# Patient Record
Sex: Male | Born: 1990 | Race: White | Hispanic: No | Marital: Single | Smoking: Current every day smoker
Health system: Southern US, Community
[De-identification: ages and names within clinical notes are randomized; demographics above are authoritative.]

## PROBLEM LIST (undated history)

## (undated) HISTORY — PX: HERNIA REPAIR: SHX51

---

## 2013-01-07 ENCOUNTER — Encounter (HOSPITAL_COMMUNITY): Payer: Self-pay | Admitting: *Deleted

## 2013-01-07 ENCOUNTER — Emergency Department (HOSPITAL_COMMUNITY)
Admission: EM | Admit: 2013-01-07 | Discharge: 2013-01-07 | Disposition: A | Discharge status: Home / Self Care | Payer: Self-pay | Attending: Emergency Medicine | Admitting: Emergency Medicine

## 2013-01-07 DIAGNOSIS — J3489 Other specified disorders of nose and nasal sinuses: Secondary | ICD-10-CM | POA: Insufficient documentation

## 2013-01-07 DIAGNOSIS — K089 Disorder of teeth and supporting structures, unspecified: Secondary | ICD-10-CM | POA: Insufficient documentation

## 2013-01-07 DIAGNOSIS — Z79899 Other long term (current) drug therapy: Secondary | ICD-10-CM | POA: Insufficient documentation

## 2013-01-07 DIAGNOSIS — J339 Nasal polyp, unspecified: Secondary | ICD-10-CM | POA: Insufficient documentation

## 2013-01-07 DIAGNOSIS — F17200 Nicotine dependence, unspecified, uncomplicated: Secondary | ICD-10-CM | POA: Insufficient documentation

## 2013-01-07 DIAGNOSIS — K0889 Other specified disorders of teeth and supporting structures: Secondary | ICD-10-CM

## 2013-01-07 MED ORDER — AMOXICILLIN 500 MG PO CAPS: 500.0000 mg | ORAL_CAPSULE | Freq: Three times a day (TID) | ORAL | Status: DC

## 2013-01-07 MED ORDER — TRAMADOL HCL 50 MG PO TABS: 50.0000 mg | ORAL_TABLET | Freq: Four times a day (QID) | ORAL | Status: DC | PRN

## 2013-01-07 NOTE — ED Provider Notes (Signed)
History     CSN: [id number]  Arrival date & time 01/07/13  1059   First MD Initiated Contact with Patient 01/07/13 1153      Chief Complaint  Patient presents with  . Dental Pain    (Consider location/radiation/quality/duration/timing/severity/associated sxs/prior treatment) Patient is a 22 y.o. male presenting with tooth pain. The history is provided by the patient.  Dental Pain Location:  Upper Upper teeth location:  14/LU 1st molar, 13/LU 2nd bicuspid and 12/LU 1st bicuspid Quality:  Throbbing and sharp Severity:  Moderate Onset quality:  Gradual Duration:  4 days Timing:  Constant Progression:  Worsening Chronicity:  Recurrent Context: dental caries and poor dentition   Prior workup: none. Relieved by:  Nothing Worsened by:  Cold food/drink and hot food/drink Ineffective treatments:  NSAIDs Associated symptoms: congestion   Associated symptoms: no difficulty swallowing, no drooling, no facial pain, no facial swelling, no fever, no gum swelling, no headaches, no neck pain, no neck swelling, no oral bleeding, no oral lesions and no trismus   Associated symptoms comment:  Difficulty breathing from the right nostril.   Congestion:    Location:  Nasal   Interferes with sleep: no     Interferes with eating/drinking: no   Risk factors: periodontal disease and smoking     History reviewed. No pertinent past medical history.  History reviewed. No pertinent past surgical history.  No family history on file.  History  Substance Use Topics  . Smoking status: Current Every Day Smoker  . Smokeless tobacco: Not on file  . Alcohol Use: No      Review of Systems  Constitutional: Negative for fever and appetite change.  HENT: Positive for congestion and dental problem. Negative for sore throat, facial swelling, drooling, mouth sores, trouble swallowing, neck pain and neck stiffness.   Eyes: Negative for pain and visual disturbance.  Neurological: Negative for dizziness,  facial asymmetry and headaches.  Hematological: Negative for adenopathy.  All other systems reviewed and are negative.    Allergies  Review of patient's allergies indicates no known allergies.  Home Medications   Current Outpatient Rx  Name  Route  Sig  Dispense  Refill  . ibuprofen (ADVIL,MOTRIN) 200 MG tablet   Oral   Take 800 mg by mouth every 6 (six) hours as needed for pain.           BP 127/84  Pulse 90  Temp(Src) 98.5 F (36.9 C) (Oral)  Resp 17  Ht 6\' 3"  (1.905 m)  Wt 189 lb 5 oz (85.872 kg)  BMI 23.66 kg/m2  SpO2 100%  Physical Exam  Nursing note and vitals reviewed. Constitutional: He is oriented to person, place, and time. He appears well-developed and well-nourished. No distress.  HENT:  Head: Normocephalic and atraumatic. No trismus in the jaw.  Right Ear: Tympanic membrane and ear canal normal.  Left Ear: Tympanic membrane and ear canal normal.  Mouth/Throat: Uvula is midline, oropharynx is clear and moist and mucous membranes are normal. Dental caries present. No dental abscesses or edematous.  Multiple dental caries and poor dentition.  ttp of the upper left lateral incisors and premolars without obvious dental abscess at this time.  No facial edema or trismus.  Patient also has a single nasal polyp of the right nostril  Neck: Normal range of motion. Neck supple.  Cardiovascular: Normal rate, regular rhythm and normal heart sounds.   No murmur heard. Pulmonary/Chest: Effort normal and breath sounds normal. No respiratory distress.  Musculoskeletal: Normal  range of motion.  Lymphadenopathy:    He has no cervical adenopathy.  Neurological: He is alert and oriented to person, place, and time. He exhibits normal muscle tone. Coordination normal.  Skin: Skin is warm and dry.    ED Course  Procedures (including critical care time)  Labs Reviewed - No data to display No results found.      MDM    Patient agrees to f/u with a dentist at the  upcoming dental clinic.  Patient appears stable for discharge at this time.  No obvious dental abscess.      Gevon Markus L. Trisha Mangle, PA-C 01/07/13 1252

## 2013-01-07 NOTE — ED Notes (Signed)
Dental pain x 3-4 days

## 2013-01-08 NOTE — ED Provider Notes (Signed)
Medical screening examination/treatment/procedure(s) were performed by non-physician practitioner and as supervising physician I was immediately available for consultation/collaboration.  Raeford Razor, MD 01/08/13 1136

## 2013-12-03 ENCOUNTER — Emergency Department (HOSPITAL_COMMUNITY)
Admission: EM | Admit: 2013-12-03 | Discharge: 2013-12-03 | Disposition: A | Discharge status: Home / Self Care | Payer: Self-pay | Attending: Emergency Medicine | Admitting: Emergency Medicine

## 2013-12-03 ENCOUNTER — Encounter (HOSPITAL_COMMUNITY): Payer: Self-pay | Admitting: Emergency Medicine

## 2013-12-03 DIAGNOSIS — Z79899 Other long term (current) drug therapy: Secondary | ICD-10-CM | POA: Insufficient documentation

## 2013-12-03 DIAGNOSIS — F17200 Nicotine dependence, unspecified, uncomplicated: Secondary | ICD-10-CM | POA: Insufficient documentation

## 2013-12-03 DIAGNOSIS — Z791 Long term (current) use of non-steroidal anti-inflammatories (NSAID): Secondary | ICD-10-CM | POA: Insufficient documentation

## 2013-12-03 DIAGNOSIS — R599 Enlarged lymph nodes, unspecified: Secondary | ICD-10-CM | POA: Insufficient documentation

## 2013-12-03 DIAGNOSIS — K029 Dental caries, unspecified: Secondary | ICD-10-CM | POA: Insufficient documentation

## 2013-12-03 DIAGNOSIS — Z792 Long term (current) use of antibiotics: Secondary | ICD-10-CM | POA: Insufficient documentation

## 2013-12-03 DIAGNOSIS — R110 Nausea: Secondary | ICD-10-CM | POA: Insufficient documentation

## 2013-12-03 DIAGNOSIS — R51 Headache: Secondary | ICD-10-CM | POA: Insufficient documentation

## 2013-12-03 DIAGNOSIS — K047 Periapical abscess without sinus: Secondary | ICD-10-CM | POA: Insufficient documentation

## 2013-12-03 MED ORDER — OXYCODONE-ACETAMINOPHEN 5-325 MG PO TABS
1.0000 | ORAL_TABLET | Freq: Once | ORAL | Status: AC
Start: 2013-12-03 — End: 2013-12-03
  Administered 2013-12-03: 1 via ORAL
  Filled 2013-12-03: qty 1

## 2013-12-03 MED ORDER — AMOXICILLIN 500 MG PO CAPS
500.0000 mg | ORAL_CAPSULE | Freq: Three times a day (TID) | ORAL | Status: DC
Start: 2013-12-03 — End: 2014-08-23

## 2013-12-03 MED ORDER — IBUPROFEN 600 MG PO TABS: 600.0000 mg | ORAL_TABLET | Freq: Four times a day (QID) | ORAL | Status: DC | PRN

## 2013-12-03 MED ORDER — HYDROCODONE-ACETAMINOPHEN 5-325 MG PO TABS: 1.0000 | ORAL_TABLET | ORAL | Status: DC | PRN

## 2013-12-03 NOTE — Discharge Instructions (Signed)
Abscessed Tooth °An abscessed tooth is an infection around your tooth. It may be caused by holes or damage to the tooth (cavity) or a dental disease. An abscessed tooth causes mild to very bad pain in and around the tooth. See your dentist right away if you have tooth or gum pain. °HOME CARE °· Take your medicine as told. Finish it even if you start to feel better. °· Do not drive after taking pain medicine. °· Rinse your mouth (gargle) often with salt water (¼ teaspoon salt in 8 ounces of warm water). °· Do not apply heat to the outside of your face. °GET HELP RIGHT AWAY IF:  °· You have a temperature by mouth above 102° F (38.9° C), not controlled by medicine. °· You have chills and a very bad headache. °· You have problems breathing or swallowing. °· Your mouth will not open. °· You develop puffiness (swelling) on the neck or around the eye. °· Your pain is not helped by medicine. °· Your pain is getting worse instead of better. °MAKE SURE YOU:  °· Understand these instructions. °· Will watch your condition. °· Will get help right away if you are not doing well or get worse. °Document Released: 01/16/2008 Document Revised: 10/22/2011 Document Reviewed: 11/07/2010 °ExitCare® Patient Information ©2014 [employer]. ° °Dental Care and Dentist Visits °Dental care supports good overall health. Regular dental visits can also help you avoid dental pain, bleeding, infection, and other more serious health problems in the future. It is important to keep the mouth healthy because diseases in the teeth, gums, and other oral tissues can spread to other areas of the body. Some problems, such as diabetes, heart disease, and pre-term labor have been associated with poor oral health.  °See your dentist every 6 months. If you experience emergency problems such as a toothache or broken tooth, go to the dentist right away. If you see your dentist regularly, you may catch problems early. It is easier to be treated for problems in  the early stages.  °WHAT TO EXPECT AT A DENTIST VISIT  °Your dentist will look for many common oral health problems and recommend proper treatment. At your regular dental visit, you can expect: °· Gentle cleaning of the teeth and gums. This includes scraping and polishing. This helps to remove the sticky substance around the teeth and gums (plaque). Plaque forms in the mouth shortly after eating. Over time, plaque hardens on the teeth as tartar. If tartar is not removed regularly, it can cause problems. Cleaning also helps remove stains. °· Periodic X-rays. These pictures of the teeth and supporting bone will help your dentist assess the health of your teeth. °· Periodic fluoride treatments. Fluoride is a natural mineral shown to help strengthen teeth. Fluoride treatment involves applying a fluoride gel or varnish to the teeth. It is most commonly done in children. °· Examination of the mouth, tongue, jaws, teeth, and gums to look for any oral health problems, such as: °· Cavities (dental caries). This is decay on the tooth caused by plaque, sugar, and acid in the mouth. It is best to catch a cavity when it is small. °· Inflammation of the gums caused by plaque buildup (gingivitis). °· Problems with the mouth or malformed or misaligned teeth. °· Oral cancer or other diseases of the soft tissues or jaws.  °KEEP YOUR TEETH AND GUMS HEALTHY °For healthy teeth and gums, follow these general guidelines as well as your dentist's specific advice: °· Have your teeth professionally cleaned at   the dentist every 6 months. °· Brush twice daily with a fluoride toothpaste. °· Floss your teeth daily.  °· Ask your dentist if you need fluoride supplements, treatments, or fluoride toothpaste. °· Eat a healthy diet. Reduce foods and drinks with added sugar. °· Avoid smoking. °TREATMENT FOR ORAL HEALTH PROBLEMS °If you have oral health problems, treatment varies depending on the conditions present in your teeth and gums. °· Your  caregiver will most likely recommend good oral hygiene at each visit. °· For cavities, gingivitis, or other oral health disease, your caregiver will perform a procedure to treat the problem. This is typically done at a separate appointment. Sometimes your caregiver will refer you to another dental specialist for specific tooth problems or for surgery. °SEEK IMMEDIATE DENTAL CARE IF: °· You have pain, bleeding, or soreness in the gum, tooth, jaw, or mouth area. °· A permanent tooth becomes loose or separated from the gum socket. °· You experience a blow or injury to the mouth or jaw area. °Document Released: 04/11/2011 Document Revised: 10/22/2011 Document Reviewed: 04/11/2011 °ExitCare® Patient Information ©2014 [employer]. ° °

## 2013-12-03 NOTE — ED Provider Notes (Signed)
CSN: [id number]     Arrival date & time 12/03/13  1513 History   First MD Initiated Contact with Patient 12/03/13 1538     Chief Complaint  Patient presents with  . Oral Swelling     (Consider location/radiation/quality/duration/timing/severity/associated sxs/prior Treatment) Patient is a 23 y.o. male presenting with tooth pain. The history is provided by the patient.  Dental Pain Location:  Upper Upper teeth location:  2/RU 2nd molar and 3/RU 1st molar Quality:  Throbbing and constant Onset quality:  Gradual Duration:  3 days Timing:  Constant Progression:  Worsening Chronicity:  New Context: abscess, dental caries and poor dentition   Relieved by:  Nothing Worsened by:  Cold food/drink and pressure Ineffective treatments:  Acetaminophen Associated symptoms: facial pain    Luppino SimpsonCasey Eber is a 23 y.o. male who presents to the ED with swelling and pain to the right side of his face. The swelling started yesterday and today began having moderate pain in the upper right dental area. He denies fever or chills but has has some nausea.   History reviewed. No pertinent past medical history. History reviewed. No pertinent past surgical history. History reviewed. No pertinent family history. History  Substance Use Topics  . Smoking status: Current Every Day Smoker  . Smokeless tobacco: Not on file  . Alcohol Use: No    Review of Systems Negative except as stated in HPI   Allergies  Review of patient's allergies indicates no known allergies.  Home Medications   Prior to Admission medications   Medication Sig Start Date End Date Taking? Authorizing Provider  amoxicillin (AMOXIL) 500 MG capsule Take 1 capsule (500 mg total) by mouth 3 (three) times daily. 01/07/13   Tammy L. Triplett, PA-C  ibuprofen (ADVIL,MOTRIN) 200 MG tablet Take 800 mg by mouth every 6 (six) hours as needed for pain.    Historical Provider, MD  traMADol (ULTRAM) 50 MG tablet Take 1 tablet (50 mg total) by  mouth every 6 (six) hours as needed for pain. 01/07/13   Tammy L. Triplett, PA-C   BP 126/83  Pulse 78  Temp(Src) 98.1 F (36.7 C) (Oral)  Resp 18  Ht 6\' 3"  (1.905 m)  Wt 210 lb (95.255 kg)  BMI 26.25 kg/m2  SpO2 98% Physical Exam  Nursing note and vitals reviewed. Constitutional: He is oriented to person, place, and time. He appears well-developed and well-nourished. No distress.  HENT:  Head: Normocephalic.    Mouth/Throat: Uvula is midline, oropharynx is clear and moist and mucous membranes are normal. Dental abscesses and dental caries present.    Tenderness, swelling to the right upper dental area. Erythema and swelling of the gum surrounding the first and second molars. Facial swelling right.  Eyes: Conjunctivae and EOM are normal.  Neck: Neck supple.  Cardiovascular: Normal rate.   Pulmonary/Chest: Effort normal.  Abdominal: Soft. There is no tenderness.  Musculoskeletal: Normal range of motion.  Lymphadenopathy:    He has cervical adenopathy.  Neurological: He is alert and oriented to person, place, and time. No cranial nerve deficit.  Skin: Skin is warm and dry.  Psychiatric: He has a normal mood and affect. His behavior is normal.    ED Course  Procedures  MDM  23 y.o. male with dental pain and facial swelling that started 3 days ago. Will treat for pain and infection. Discussed with the patient and all questioned fully answered. He will follow up with a dentist as soon as possible.  He will call me  if any problems arise. Stable for discharge without fever.    Medication List         amoxicillin 500 MG capsule  Commonly known as:  AMOXIL  Take 1 capsule (500 mg total) by mouth 3 (three) times daily.     HYDROcodone-acetaminophen 5-325 MG per tablet  Commonly known as:  NORCO/VICODIN  Take 1 tablet by mouth every 4 (four) hours as needed.     ibuprofen 600 MG tablet  Commonly known as:  ADVIL,MOTRIN  Take 1 tablet (600 mg total) by mouth every 6 (six)  hours as needed.             [place]axvilleHope M Gracin Soohoo, NP 12/03/13 [address]1611  Darriel Sinquefield Orlene OchM Nannie Starzyk, NP 12/10/13 1556

## 2013-12-03 NOTE — ED Notes (Signed)
Swelling right side of face.

## 2013-12-03 NOTE — ED Notes (Signed)
D/c instructions given to pt.  Unable to get signature ,  Chart locked up.  Pt verbalized understanding.

## 2013-12-12 NOTE — ED Provider Notes (Signed)
Medical screening examination/treatment/procedure(s) were performed by non-physician practitioner and as supervising physician I was immediately available for consultation/collaboration.   EKG Interpretation None       Tavien Chestnut, MD 12/12/13 1418 

## 2014-08-23 ENCOUNTER — Emergency Department (HOSPITAL_COMMUNITY)
Admission: EM | Admit: 2014-08-23 | Discharge: 2014-08-23 | Disposition: A | Discharge status: Home / Self Care | Payer: Self-pay | Attending: Emergency Medicine | Admitting: Emergency Medicine

## 2014-08-23 ENCOUNTER — Encounter (HOSPITAL_COMMUNITY): Payer: Self-pay | Admitting: *Deleted

## 2014-08-23 DIAGNOSIS — Z720 Tobacco use: Secondary | ICD-10-CM | POA: Insufficient documentation

## 2014-08-23 DIAGNOSIS — K4090 Unilateral inguinal hernia, without obstruction or gangrene, not specified as recurrent: Secondary | ICD-10-CM | POA: Insufficient documentation

## 2014-08-23 MED ORDER — IBUPROFEN 600 MG PO TABS: 600.0000 mg | ORAL_TABLET | Freq: Four times a day (QID) | ORAL | Status: AC | PRN

## 2014-08-23 NOTE — Discharge Instructions (Signed)

## 2014-08-23 NOTE — ED Notes (Signed)
Patient states lower left abdominal/upper pelvis pain X3 months. Patient states that over the past few days the pain has increased. Patient states pain is increased with coughing and bowel movements.

## 2014-08-23 NOTE — ED Provider Notes (Signed)
CSN: [id number]     Arrival date & time 08/23/14  1738 History  This chart was scribed for Linwood DibblesJon Antron Seth, MD by Tonye RoyaltyJoshua Chen, ED Scribe. This patient was seen in room APA12/[facility]12 and the patient's care was started at 8:41 PM.    Chief Complaint  Patient presents with  . Abdominal Pain   The history is provided by the patient. No language interpreter was used.    HPI Comments: Benjamin SimpsonCasey Reyes is a 24 y.o. male who presents to the Emergency Department complaining of "painful knot" to his left lower abdomen with onset 3 months ago. [facility] and diagnosed with hernia. He states he was referred to a surgeon, but he does not have insurance. He states he was not prescribed any pain medication and complains that they did not do anything for him. He states it is more prominent when standing up. He denies vomiting, chills, fever, or bowel abnormalities.  History reviewed. No pertinent past medical history. History reviewed. No pertinent past surgical history. History reviewed. No pertinent family history. History  Substance Use Topics  . Smoking status: Current Every Day Smoker  . Smokeless tobacco: Not on file  . Alcohol Use: Yes    Review of Systems  Constitutional: Negative for fever and chills.  Gastrointestinal: Negative for vomiting, diarrhea, constipation and blood in stool.       Hernia  All other systems reviewed and are negative.     Allergies  Review of patient's allergies indicates no known allergies.  Home Medications   Prior to Admission medications   Medication Sig Start Date End Date Taking? Authorizing Provider  ibuprofen (ADVIL,MOTRIN) 600 MG tablet Take 1 tablet (600 mg total) by mouth every 6 (six) hours as needed. 08/23/14   Linwood DibblesJon Viola Placeres, MD   BP 120/71 mmHg  Pulse 70  Temp(Src) 97.9 F (36.6 C) (Oral)  Resp 18  Ht 6' 3.5" (1.918 m)  Wt 201 lb (91.173 kg)  BMI 24.78 kg/m2  SpO2 99% Physical Exam  Constitutional: He appears well-developed and  well-nourished. No distress.  HENT:  Head: Normocephalic and atraumatic.  Right Ear: External ear normal.  Left Ear: External ear normal.  Eyes: Conjunctivae are normal. Right eye exhibits no discharge. Left eye exhibits no discharge. No scleral icterus.  Neck: Neck supple. No tracheal deviation present.  Cardiovascular: Normal rate.   Pulmonary/Chest: Effort normal. No stridor. No respiratory distress.  Abdominal: Soft. He exhibits no mass. There is no tenderness. There is no rigidity, no rebound and no guarding. A hernia is present. Hernia confirmed positive in the left inguinal area.  Left inguinal hernia noticeable when patient is standing, completely resolves when he is supine  Musculoskeletal: He exhibits no edema.  Neurological: He is alert. Cranial nerve deficit: no gross deficits.  Skin: Skin is warm and dry. No rash noted.  Psychiatric: He has a normal mood and affect.  Nursing note and vitals reviewed.   ED Course  Procedures (including critical care time)  DIAGNOSTIC STUDIES: Oxygen Saturation is 100% on room air, normal by my interpretation.    COORDINATION OF CARE: 8:48 PM Discussed with patient that his hernia does not necessarily need surgery at this time since it resolves when he lays down and is easily reducible, and that the only appropriate treatment would be surgery. Will provide referral to a surgeon as well as pain medication. The patient agrees with the plan and has no further questions at this time.  MDM   Final diagnoses:  Unilateral inguinal hernia without obstruction or gangrene, recurrence not specified   Discussed the findings with the patient.  Refer as an outpatient to surgery.  Discussed warning signs that should prompt him to return to the ED emergently.  I personally performed the services described in this documentation, which was scribed in my presence.  The recorded information has been reviewed and is accurate.   Linwood Dibbles,  MD 08/23/14 2056

## 2014-08-23 NOTE — ED Notes (Signed)
Patient verbalizes understanding of discharge instructions, home care and follow up care. Patient ambulatory out of department at this time with family. 

## 2014-08-23 NOTE — ED Notes (Addendum)
[facility], with low abd pain , dx with   "pulled groin muscle".  No nvd.  No fever.   NO injury  Says there is a place in low abd that "bulges out " when stands up for a while

## 2018-12-31 ENCOUNTER — Encounter (HOSPITAL_COMMUNITY): Payer: Self-pay | Admitting: *Deleted

## 2018-12-31 ENCOUNTER — Other Ambulatory Visit: Payer: Self-pay

## 2018-12-31 ENCOUNTER — Emergency Department (HOSPITAL_COMMUNITY)
Admission: EM | Admit: 2018-12-31 | Discharge: 2018-12-31 | Disposition: A | Discharge status: Home / Self Care | Payer: Self-pay | Attending: Emergency Medicine | Admitting: Emergency Medicine

## 2018-12-31 DIAGNOSIS — Z79899 Other long term (current) drug therapy: Secondary | ICD-10-CM | POA: Insufficient documentation

## 2018-12-31 DIAGNOSIS — M5416 Radiculopathy, lumbar region: Secondary | ICD-10-CM | POA: Insufficient documentation

## 2018-12-31 DIAGNOSIS — F17210 Nicotine dependence, cigarettes, uncomplicated: Secondary | ICD-10-CM | POA: Insufficient documentation

## 2018-12-31 DIAGNOSIS — R21 Rash and other nonspecific skin eruption: Secondary | ICD-10-CM | POA: Insufficient documentation

## 2018-12-31 MED ORDER — IBUPROFEN 400 MG PO TABS
600.0000 mg | ORAL_TABLET | Freq: Once | ORAL | Status: AC
  Administered 2018-12-31: 600 mg via ORAL
  Filled 2018-12-31: qty 2

## 2018-12-31 MED ORDER — HYDROCORTISONE 1 % EX CREA: TOPICAL_CREAM | CUTANEOUS | 0 refills | Status: AC

## 2018-12-31 MED ORDER — OXYCODONE-ACETAMINOPHEN 5-325 MG PO TABS
1.0000 | ORAL_TABLET | Freq: Once | ORAL | Status: AC
  Administered 2018-12-31: 1 via ORAL
  Filled 2018-12-31: qty 1

## 2018-12-31 MED ORDER — DEXAMETHASONE 4 MG PO TABS
8.0000 mg | ORAL_TABLET | Freq: Once | ORAL | Status: AC
  Administered 2018-12-31: 8 mg via ORAL
  Filled 2018-12-31: qty 2

## 2018-12-31 MED ORDER — MELOXICAM 15 MG PO TABS: 15.0000 mg | ORAL_TABLET | Freq: Every day | ORAL | 0 refills | Status: DC

## 2018-12-31 MED ORDER — PREDNISONE 20 MG PO TABS: 40.0000 mg | ORAL_TABLET | Freq: Every day | ORAL | 0 refills | Status: DC

## 2018-12-31 NOTE — ED Notes (Signed)
ED Provider at bedside. 

## 2018-12-31 NOTE — ED Triage Notes (Signed)
Pt complains of back pain shooting down left leg and not being able to sleep due to pain and having bug bites "everywhere." Pt unknown if he was around anyone who had scabies.

## 2019-01-01 NOTE — ED Provider Notes (Signed)
[facility] EMERGENCY DEPARTMENT Provider Note   CSN: [id number] Arrival date & time: 12/31/18  1609    History   Chief Complaint Chief Complaint  Patient presents with  . Insect Bite  . Back Pain    HPI Benjamin Reyes is a 28 y.o. male.     HPI   27yM with lower back pain and rash. 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 Got much worse over the course of a day. Worse with movement and radiates into LLE. No numbness, tingling or loss of strength. No hx of similar symptoms.   Also c/o pruritic rash. Diffuse. No contacts with similar. No new exposures that he is aware of.   History reviewed. No pertinent past medical history.  There are no active problems to display for this patient.   Past Surgical History:  Procedure Laterality Date  . HERNIA REPAIR          Home Medications    Prior to Admission medications   Medication Sig Start Date End Date Taking? Authorizing Provider  hydrocortisone cream 1 % Apply to affected area 2 times daily 12/31/18   Raeford Razor, MD  ibuprofen (ADVIL,MOTRIN) 600 MG tablet Take 1 tablet (600 mg total) by mouth every 6 (six) hours as needed. 08/23/14   Linwood Dibbles, MD  meloxicam (MOBIC) 15 MG tablet Take 1 tablet (15 mg total) by mouth daily. 12/31/18   Raeford Razor, MD  predniSONE (DELTASONE) 20 MG tablet Take 2 tablets (40 mg total) by mouth daily. 12/31/18   Raeford Razor, MD    Family History Family History  Problem Relation Age of Onset  . Hypertension Mother   . Hypertension Father     Social History Social History   Tobacco Use  . Smoking status: Current Every Day Smoker    Packs/day: 0.50  . Smokeless tobacco: Never Used  Substance Use Topics  . Alcohol use: Not Currently  . Drug use: No     Allergies   Poison ivy extract and Bee venom   Review of Systems Review of Systems  All systems reviewed and negative, other  than as noted in HPI.  Physical Exam Updated Vital Signs BP 133/72 (BP Location: Right Arm)   Pulse 81   Temp 98.4 F (36.9 C) (Oral)   Resp 16   Ht 6' 3.5" (1.918 m)   Wt 85.7 kg   SpO2 100%   BMI 23.31 kg/m   Physical Exam Vitals signs and nursing note reviewed.  Constitutional:      General: He is not in acute distress.    Appearance: He is well-developed.  HENT:     Head: Normocephalic and atraumatic.  Eyes:     General:        Right eye: No discharge.        Left eye: No discharge.     Conjunctiva/sclera: Conjunctivae normal.  Neck:     Musculoskeletal: Neck supple.  Cardiovascular:     Rate and Rhythm: Normal rate and regular rhythm.     Heart sounds: Normal heart sounds. No murmur. No friction rub. No gallop.   Pulmonary:     Effort: Pulmonary effort is normal. No respiratory distress.     Breath sounds: Normal breath sounds.  Abdominal:     General: There is no distension.     Palpations: Abdomen is soft.     Tenderness: There  is no abdominal tenderness.  Musculoskeletal:        General: No tenderness.  Skin:    General: Skin is warm and dry.     Findings: Rash present.     Comments: Diffuse but sparse erythematous rash and excoriations. Nonspecific. No mucus membrane involvement.   Neurological:     Mental Status: He is alert.  Psychiatric:        Behavior: Behavior normal.        Thought Content: Thought content normal.      ED Treatments / Results  Labs (all labs ordered are listed, but only abnormal results are displayed) Labs Reviewed - No data to display  EKG None  Radiology No results found.  Procedures Procedures (including critical care time)  Medications Ordered in ED Medications  dexamethasone (DECADRON) tablet 8 mg (8 mg Oral Given 12/31/18 1646)  oxyCODONE-acetaminophen (PERCOCET/ROXICET) 5-325 MG per tablet 1 tablet (1 tablet Oral Given 12/31/18 1646)  ibuprofen (ADVIL) tablet 600 mg (600 mg Oral Given 12/31/18 1646)      Initial Impression / Assessment and Plan / ED Course  I have reviewed the triage vital signs and the nursing notes.  Pertinent labs & imaging results that were available during my care of the patient were reviewed by me and considered in my medical decision making (see chart for details).       Lower back pain consistent with sciatica. Non focal neuro exam. PRN meds. Steroids should help with rash too. Topical as well. It has been determined that no acute conditions requiring further emergency intervention are present at this time. The patient has been advised of the diagnosis and plan. I reviewed any labs and imaging including any potential incidental findings. I have reviewed nursing notes and appropriate previous records. We have discussed signs and symptoms that warrant return to the ED and they are listed in the discharge instructions.       Final Clinical Impressions(s) / ED Diagnoses   Final diagnoses:  Acute radicular low back pain  Rash    ED Discharge Orders         Ordered    predniSONE (DELTASONE) 20 MG tablet  Daily     12/31/18 1639    meloxicam (MOBIC) 15 MG tablet  Daily     12/31/18 1639    hydrocortisone cream 1 %     12/31/18 1639           Raeford RazorKohut, Arora Coakley, MD 01/01/19 1627

## 2020-04-27 ENCOUNTER — Emergency Department (HOSPITAL_COMMUNITY): Payer: HRSA Program

## 2020-04-27 ENCOUNTER — Emergency Department (HOSPITAL_COMMUNITY)
Admission: EM | Admit: 2020-04-27 | Discharge: 2020-04-27 | Disposition: A | Discharge status: Home / Self Care | Payer: HRSA Program | Attending: Emergency Medicine | Admitting: Emergency Medicine

## 2020-04-27 ENCOUNTER — Encounter (HOSPITAL_COMMUNITY): Payer: Self-pay

## 2020-04-27 ENCOUNTER — Other Ambulatory Visit: Payer: Self-pay

## 2020-04-27 DIAGNOSIS — F17200 Nicotine dependence, unspecified, uncomplicated: Secondary | ICD-10-CM | POA: Insufficient documentation

## 2020-04-27 DIAGNOSIS — R197 Diarrhea, unspecified: Secondary | ICD-10-CM | POA: Diagnosis present

## 2020-04-27 DIAGNOSIS — U071 COVID-19: Secondary | ICD-10-CM | POA: Diagnosis not present

## 2020-04-27 LAB — COMPREHENSIVE METABOLIC PANEL
ALT: 16 U/L (ref 0–44)
AST: 18 U/L (ref 15–41)
Albumin: 4.1 g/dL (ref 3.5–5.0)
Alkaline Phosphatase: 87 U/L (ref 38–126)
Anion gap: 8 (ref 5–15)
BUN: 14 mg/dL (ref 6–20)
CO2: 26 mmol/L (ref 22–32)
Calcium: 8.9 mg/dL (ref 8.9–10.3)
Chloride: 100 mmol/L (ref 98–111)
Creatinine, Ser: 0.97 mg/dL (ref 0.61–1.24)
GFR calc Af Amer: >60 mL/min (ref >60)
GFR calc non Af Amer: >60 mL/min (ref >60)
Glucose, Bld: 92 mg/dL (ref 70–99)
Potassium: 3.8 mmol/L (ref 3.5–5.1)
Sodium: 134 mmol/L — ABNORMAL LOW (ref 135–145)
Total Bilirubin: 1.1 mg/dL (ref 0.3–1.2)
Total Protein: 7.3 g/dL (ref 6.5–8.1)

## 2020-04-27 LAB — CBC WITH DIFFERENTIAL/PLATELET
Abs Immature Granulocytes: 0.01 10*3/uL (ref 0.00–0.07)
Basophils Absolute: 0 10*3/uL (ref 0.0–0.1)
Basophils Relative: 0 %
Eosinophils Absolute: 0 10*3/uL (ref 0.0–0.5)
Eosinophils Relative: 0 %
HCT: 46.3 % (ref 39.0–52.0)
Hemoglobin: 15.9 g/dL (ref 13.0–17.0)
Immature Granulocytes: 0 %
Lymphocytes Relative: 18 %
Lymphs Abs: 0.5 10*3/uL — ABNORMAL LOW (ref 0.7–4.0)
MCH: 32.5 pg (ref 26.0–34.0)
MCHC: 34.3 g/dL (ref 30.0–36.0)
MCV: 94.7 fL (ref 80.0–100.0)
Monocytes Absolute: 0.4 10*3/uL (ref 0.1–1.0)
Monocytes Relative: 14 %
Neutro Abs: 1.8 10*3/uL (ref 1.7–7.7)
Neutrophils Relative %: 68 %
Platelets: 122 10*3/uL — ABNORMAL LOW (ref 150–400)
RBC: 4.89 MIL/uL (ref 4.22–5.81)
RDW: 12.2 % (ref 11.5–15.5)
WBC: 2.7 10*3/uL — ABNORMAL LOW (ref 4.0–10.5)
nRBC: 0 % (ref 0.0–0.2)

## 2020-04-27 LAB — SARS CORONAVIRUS 2 BY RT PCR (HOSPITAL ORDER, PERFORMED IN ~~LOC~~ HOSPITAL LAB): SARS Coronavirus 2: POSITIVE — AB

## 2020-04-27 MED ORDER — ACETAMINOPHEN 500 MG PO TABS
1000.0000 mg | ORAL_TABLET | Freq: Once | ORAL | Status: AC
  Administered 2020-04-27: 1000 mg via ORAL
  Filled 2020-04-27: qty 2

## 2020-04-27 MED ORDER — ONDANSETRON 4 MG PO TBDP: 4.0000 mg | ORAL_TABLET | Freq: Three times a day (TID) | ORAL | 0 refills | Status: DC | PRN

## 2020-04-27 MED ORDER — ONDANSETRON 8 MG PO TBDP: 8.0000 mg | ORAL_TABLET | Freq: Once | ORAL | Status: DC

## 2020-04-27 MED ORDER — ONDANSETRON 4 MG PO TBDP
4.0000 mg | ORAL_TABLET | Freq: Once | ORAL | Status: AC
Start: 2020-04-27 — End: 2020-04-27
  Administered 2020-04-27: 4 mg via ORAL
  Filled 2020-04-27: qty 1

## 2020-04-27 NOTE — ED Notes (Signed)
CRITICAL VALUE ALERT  Critical Value: covid positive  Date & Time Notied:  04/27/2020, 5176  Provider Notified: Dr. Estell Harpin  Orders Received/Actions taken: see chart

## 2020-04-27 NOTE — ED Notes (Signed)
Patient states nausea is some better,  He is going to try and drink some water.

## 2020-04-27 NOTE — ED Triage Notes (Signed)
Pt reports fever, bodyaches, headache, and diarrhea x 2 days.

## 2020-04-27 NOTE — Discharge Instructions (Addendum)
Your COVID-19 test was positive.  It is very important that you try to minimize how much you expose other people to the virus.  Especially people who are unvaccinated you are at high risk because they are not immunized.  COVID-19 is a self limited illness.  Please monitor your symptoms and use Tylenol or ibuprofen for pain.  You may use 600 mg ibuprofen every 6 hours or 1000 mg of Tylenol every 6 hours.  You may choose to alternate between the 2.  This would be most effective.  Not to exceed 4 g of Tylenol within 24 hours.  Not to exceed 3200 mg ibuprofen 24 hours.   In the meantime follow CDC guidelines and quarantine, wear a mask, wash hands often.   Please take over the counter vitamin D 2000-4000 units per day. I also recommend zinc 50 mg per day for the next two weeks.   Please return to ED if you feel have difficulty breathing or have emergent, new or concerning symptoms.  Patients who have symptoms consistent with COVID-19 should self isolated for: At least 3 days (72 hours) have passed since recovery, defined as resolution of fever without the use of fever reducing medications and improvement in respiratory symptoms (e.g., cough, shortness of breath), and At least 7 days have passed since symptoms first appeared.       Person Under Monitoring Name: Benjamin Reyes  Location: [address] [place] [address]   Infection Prevention Recommendations for Individuals Confirmed to have, or Being Evaluated for, 2019 Novel Coronavirus (COVID-19) Infection Who Receive Care at Home  Individuals who are confirmed to have, or are being evaluated for, COVID-19 should follow the prevention steps below until a healthcare provider or local or state health department says they can return to normal activities.  Stay home except to get medical care You should restrict activities outside your home, except for getting medical care. Do not go to work, school, or public areas, and do not use public  transportation or taxis.  Call ahead before visiting your doctor Before your medical appointment, call the healthcare provider and tell them that you have, or are being evaluated for, COVID-19 infection. This will help the healthcare provider's office take steps to keep other people from getting infected. Ask your healthcare provider to call the local or state health department.  Monitor your symptoms Seek prompt medical attention if your illness is worsening (e.g., difficulty breathing). Before going to your medical appointment, call the healthcare provider and tell them that you have, or are being evaluated for, COVID-19 infection. Ask your healthcare provider to call the local or state health department.  Wear a facemask You should wear a facemask that covers your nose and mouth when you are in the same room with other people and when you visit a healthcare provider. People who live with or visit you should also wear a facemask while they are in the same room with you.  Separate yourself from other people in your home As much as possible, you should stay in a different room from other people in your home. Also, you should use a separate bathroom, if available.  Avoid sharing household items You should not share dishes, drinking glasses, cups, eating utensils, towels, bedding, or other items with other people in your home. After using these items, you should wash them thoroughly with soap and water.  Cover your coughs and sneezes Cover your mouth and nose with a tissue when you cough or sneeze, or  you can cough or sneeze into your sleeve. Throw used tissues in a lined trash can, and immediately wash your hands with soap and water for at least 20 seconds or use an alcohol-based hand rub.  Wash your [employer] your hands often and thoroughly with soap and water for at least 20 seconds. You can use an alcohol-based hand sanitizer if soap and water are not available and if your hands  are not visibly dirty. Avoid touching your eyes, nose, and mouth with unwashed hands.   Prevention Steps for Caregivers and Household Members of Individuals Confirmed to have, or Being Evaluated for, COVID-19 Infection Being Cared for in the Home  If you live with, or provide care at home for, a person confirmed to have, or being evaluated for, COVID-19 infection please follow these guidelines to prevent infection:  Follow healthcare provider's instructions Make sure that you understand and can help the patient follow any healthcare provider instructions for all care.  Provide for the patient's basic needs You should help the patient with basic needs in the home and provide support for getting groceries, prescriptions, and other personal needs.  Monitor the patient's symptoms If they are getting sicker, call his or her medical provider and tell them that the patient has, or is being evaluated for, COVID-19 infection. This will help the healthcare provider's office take steps to keep other people from getting infected. Ask the healthcare provider to call the local or state health department.  Limit the number of people who have contact with the patient If possible, have only one caregiver for the patient. Other household members should stay in another home or place of residence. If this is not possible, they should stay in another room, or be separated from the patient as much as possible. Use a separate bathroom, if available. Restrict visitors who do not have an essential need to be in the home.  Keep older adults, very young children, and other sick people away from the patient Keep older adults, very young children, and those who have compromised immune systems or chronic health conditions away from the patient. This includes people with chronic heart, lung, or kidney conditions, diabetes, and cancer.  Ensure good ventilation Make sure that shared spaces in the home have good air  flow, such as from an air conditioner or an opened window, weather permitting.  Wash your hands often Wash your hands often and thoroughly with soap and water for at least 20 seconds. You can use an alcohol based hand sanitizer if soap and water are not available and if your hands are not visibly dirty. Avoid touching your eyes, nose, and mouth with unwashed hands. Use disposable paper towels to dry your hands. If not available, use dedicated cloth towels and replace them when they become wet.  Wear a facemask and gloves Wear a disposable facemask at all times in the room and gloves when you touch or have contact with the patient's blood, body fluids, and/or secretions or excretions, such as sweat, saliva, sputum, nasal mucus, vomit, urine, or feces.  Ensure the mask fits over your nose and mouth tightly, and do not touch it during use. Throw out disposable facemasks and gloves after using them. Do not reuse. Wash your hands immediately after removing your facemask and gloves. If your personal clothing becomes contaminated, carefully remove clothing and launder. Wash your hands after handling contaminated clothing. Place all used disposable facemasks, gloves, and other waste in a lined container before disposing them with  other household waste. Remove gloves and wash your hands immediately after handling these items.  Do not share dishes, glasses, or other household items with the patient Avoid sharing household items. You should not share dishes, drinking glasses, cups, eating utensils, towels, bedding, or other items with a patient who is confirmed to have, or being evaluated for, COVID-19 infection. After the person uses these items, you should wash them thoroughly with soap and water.  Wash laundry thoroughly Immediately remove and wash clothes or bedding that have blood, body fluids, and/or secretions or excretions, such as sweat, saliva, sputum, nasal mucus, vomit, urine, or feces, on  them. Wear gloves when handling laundry from the patient. Read and follow directions on labels of laundry or clothing items and detergent. In general, wash and dry with the warmest temperatures recommended on the label.  Clean all areas the individual has used often Clean all touchable surfaces, such as counters, tabletops, doorknobs, bathroom fixtures, toilets, phones, keyboards, tablets, and bedside tables, every day. Also, clean any surfaces that may have blood, body fluids, and/or secretions or excretions on them. Wear gloves when cleaning surfaces the patient has come in contact with. Use a diluted bleach solution (e.g., dilute bleach with 1 part bleach and 10 parts water) or a household disinfectant with a label that says EPA-registered for coronaviruses. To make a bleach solution at home, add 1 tablespoon of bleach to 1 quart (4 cups) of water. For a larger supply, add  cup of bleach to 1 gallon (16 cups) of water. Read labels of cleaning products and follow recommendations provided on product labels. Labels contain instructions for safe and effective use of the cleaning product including precautions you should take when applying the product, such as wearing gloves or eye protection and making sure you have good ventilation during use of the product. Remove gloves and wash hands immediately after cleaning.  Monitor yourself for signs and symptoms of illness Caregivers and household members are considered close contacts, should monitor their health, and will be asked to limit movement outside of the home to the extent possible. Follow the monitoring steps for close contacts listed on the symptom monitoring form.   ? If you have additional questions, contact your local health department or call the epidemiologist on call at [phone] (available 24/7). ? This guidance is subject to change. For the most up-to-date guidance from [facility], please refer to their  website: [email or web address]

## 2020-04-27 NOTE — ED Provider Notes (Signed)
[facility] EMERGENCY DEPARTMENT Provider Note   CSN: [id number] Arrival date & time: 04/27/20  8099     History Chief Complaint  Patient presents with  . Generalized Body Aches  . Headache    Benjamin Reyes is a 29 y.o. male.  HPI Patient is a 28 year old male with no pertinent past medical history presented today with 2 days of body aches, diarrhea, headache, fever, one episode of vomiting and general myalgias.  He states that he is not vaccinated for Covid.  He denies any chest pain, shortness of breath, lightheadedness or dizziness.  He states he has been eating and drinking although he has less desire to do so.  Denies any aggravating or mitigating factors. Denies any medications for his symptoms before. He has not tried anything prior to arrival.  Patient describes his myalgias with full body, achy, no aggravating mitigating factors.  He has tried no medications prior to arrival.    History reviewed. No pertinent past medical history.  There are no problems to display for this patient.   Past Surgical History:  Procedure Laterality Date  . HERNIA REPAIR         Family History  Problem Relation Age of Onset  . Hypertension Mother   . Hypertension Father     Social History   Tobacco Use  . Smoking status: Current Every Day Smoker    Packs/day: 0.50  . Smokeless tobacco: Never Used  Substance Use Topics  . Alcohol use: Not Currently  . Drug use: No    Home Medications Prior to Admission medications   Medication Sig Start Date End Date Taking? Authorizing Provider  hydrocortisone cream 1 % Apply to affected area 2 times daily 12/31/18   Raeford Razor, MD  ibuprofen (ADVIL,MOTRIN) 600 MG tablet Take 1 tablet (600 mg total) by mouth every 6 (six) hours as needed. 08/23/14   Linwood Dibbles, MD  meloxicam (MOBIC) 15 MG tablet Take 1 tablet (15 mg total) by mouth daily. 12/31/18   Raeford Razor, MD  ondansetron (ZOFRAN ODT) 4 MG disintegrating tablet Take 1 tablet  (4 mg total) by mouth every 8 (eight) hours as needed for nausea or vomiting. 04/27/20   Gailen Shelter, PA  predniSONE (DELTASONE) 20 MG tablet Take 2 tablets (40 mg total) by mouth daily. 12/31/18   Raeford Razor, MD    Allergies    Poison ivy extract and Bee venom  Review of Systems   Review of Systems  Constitutional: Positive for fatigue and fever. Negative for chills.  HENT: Negative for congestion.   Respiratory: Negative for shortness of breath.   Cardiovascular: Negative for chest pain.  Gastrointestinal: Positive for diarrhea, nausea and vomiting. Negative for abdominal pain.  Musculoskeletal: Positive for myalgias. Negative for neck pain.    Physical Exam Updated Vital Signs BP 122/70 (BP Location: Right Arm)   Pulse 71   Temp 98.4 F (36.9 C) (Oral)   Resp 18   Ht 6\' 3"  (1.905 m)   Wt 85.7 kg   SpO2 95%   BMI 23.62 kg/m   Physical Exam Vitals and nursing note reviewed.  Constitutional:      General: He is not in acute distress. HENT:     Head: Normocephalic and atraumatic.     Nose: Nose normal.  Eyes:     General: No scleral icterus. Cardiovascular:     Rate and Rhythm: Normal rate and regular rhythm.     Pulses: Normal pulses.     Heart  sounds: Normal heart sounds.  Pulmonary:     Effort: Pulmonary effort is normal. No respiratory distress.     Breath sounds: Normal breath sounds. No wheezing.  Abdominal:     Palpations: Abdomen is soft.     Tenderness: There is no abdominal tenderness. There is no guarding or rebound.  Musculoskeletal:     Cervical back: Normal range of motion.     Right lower leg: No edema.     Left lower leg: No edema.  Skin:    General: Skin is warm and dry.     Capillary Refill: Capillary refill takes less than 2 seconds.  Neurological:     Mental Status: He is alert. Mental status is at baseline.  Psychiatric:        Mood and Affect: Mood normal.        Behavior: Behavior normal.     ED Results / Procedures /  Treatments   Labs (all labs ordered are listed, but only abnormal results are displayed) Labs Reviewed  SARS CORONAVIRUS 2 BY RT PCR (HOSPITAL ORDER, PERFORMED IN [facility]) - Abnormal; Notable for the following components:      Result Value   SARS Coronavirus 2 POSITIVE (*)    All other components within normal limits  CBC WITH DIFFERENTIAL/PLATELET - Abnormal; Notable for the following components:   WBC 2.7 (*)    Platelets 122 (*)    Lymphs Abs 0.5 (*)    All other components within normal limits  COMPREHENSIVE METABOLIC PANEL - Abnormal; Notable for the following components:   Sodium 134 (*)    All other components within normal limits    EKG None  Radiology No results found.  Procedures Procedures (including critical care time)  Medications Ordered in ED Medications  acetaminophen (TYLENOL) tablet 1,000 mg (1,000 mg Oral Given 04/27/20 0926)  ondansetron (ZOFRAN-ODT) disintegrating tablet 4 mg (4 mg Oral Given 04/27/20 6948)    ED Course  I have reviewed the triage vital signs and the nursing notes.  Pertinent labs & imaging results that were available during my care of the patient were reviewed by me and considered in my medical decision making (see chart for details).    MDM Rules/Calculators/A&P                          Patient is 29 year old male with past medical history detailed above presented today for symptoms consistent with COVID-19 and found to be positive.  He has had some GI issues and therefore basic labs were obtained that shows no electrolyte derangement.  CMP without significant electrolyte abnormality.  CBC without leukocytosis or anemia.  Very mild thrombocytopenia and neutropenia which may be consistent with COVID-19 infection.  Covid swab is positive.  Chest x-ray shows no acute abnormality agree with radiology read.  Patient is not hypoxic and is well-appearing overall.  Given conservative management discharge with Tylenol,  ibuprofen and Zofran. Patient is understanding of plan and agreeable.  Benjamin Reyes was evaluated in Emergency Department on 04/30/2020 for the symptoms described in the history of present illness. He was evaluated in the context of the global COVID-19 pandemic, which necessitated consideration that the patient might be at risk for infection with the SARS-CoV-2 virus that causes COVID-19. Institutional protocols and algorithms that pertain to the evaluation of patients at risk for COVID-19 are in a state of rapid change based on information released by regulatory bodies including the CDC and  federal and state organizations. These policies and algorithms were followed during the patient's care in the ED.  Final Clinical Impression(s) / ED Diagnoses Final diagnoses:  COVID-19  Diarrhea, unspecified type    Rx / DC Orders ED Discharge Orders         Ordered    ondansetron (ZOFRAN ODT) 4 MG disintegrating tablet  Every 8 hours PRN        04/27/20 1106           Solon Augusta Fre[place], [place] 04/30/20 2302    Bethann Berkshire, MD 05/02/20 [phone]

## 2020-04-28 ENCOUNTER — Other Ambulatory Visit: Payer: Self-pay | Admitting: Internal Medicine

## 2020-04-28 DIAGNOSIS — Z720 Tobacco use: Secondary | ICD-10-CM

## 2020-04-28 DIAGNOSIS — U071 COVID-19: Secondary | ICD-10-CM

## 2020-04-28 NOTE — Progress Notes (Signed)
I connected by phone with Benjamin Reyes on 04/28/2020 at 1:03 PM to discuss the potential use of a new treatment for mild to moderate COVID-19 viral infection in non-hospitalized patients.  This patient is a 29 y.o. male that meets the FDA criteria for Emergency Use Authorization of COVID monoclonal antibody casirivimab/imdevimab.  Has a (+) direct SARS-CoV-2 viral test result  Has mild or moderate COVID-19   Is NOT hospitalized due to COVID-19  Is within 10 days of symptom onset  Has at least one of the high risk factor(s) for progression to severe COVID-19 and/or hospitalization as defined in EUA.  Specific high risk criteria : Other high risk medical condition per CDC:  tobacco abuse and SVI   I have spoken and communicated the following to the patient or parent/caregiver regarding COVID monoclonal antibody treatment:  1. FDA has authorized the emergency use for the treatment of mild to moderate COVID-19 in adults and pediatric patients with positive results of direct SARS-CoV-2 viral testing who are 45 years of age and older weighing at least 40 kg, and who are at high risk for progressing to severe COVID-19 and/or hospitalization.  2. The significant known and potential risks and benefits of COVID monoclonal antibody, and the extent to which such potential risks and benefits are unknown.  3. Information on available alternative treatments and the risks and benefits of those alternatives, including clinical trials.  4. Patients treated with COVID monoclonal antibody should continue to self-isolate and use infection control measures (e.g., wear mask, isolate, social distance, avoid sharing personal items, clean and disinfect "high touch" surfaces, and frequent handwashing) according to CDC guidelines.   5. The patient or parent/caregiver has the option to accept or refuse COVID monoclonal antibody treatment.  After reviewing this information with the patient, The patient agreed to  proceed with receiving casirivimab\imdevimab infusion and will be provided a copy of the Fact sheet prior to receiving the infusion.   Infusion scheduled for 9/17 at 1400.   Marcy Salvo, NP 04/28/2020 1:03 PM

## 2020-04-29 ENCOUNTER — Ambulatory Visit (HOSPITAL_COMMUNITY): Payer: Self-pay

## 2020-04-30 ENCOUNTER — Ambulatory Visit (HOSPITAL_COMMUNITY): Payer: Self-pay

## 2021-08-04 IMAGING — DX DG CHEST 1V PORT
1 series · 1 of 1 positions shown · non-contrast
Comparison: Prior chest radiograph 11/08/2019.

CLINICAL DATA: Cough, abnormal lung sounds. Additional history
provided: Patient reports fever, body aches, headache and diarrhea
for 2 days, COVID positive today.

EXAM:
PORTABLE CHEST 1 VIEW

[chest ap]
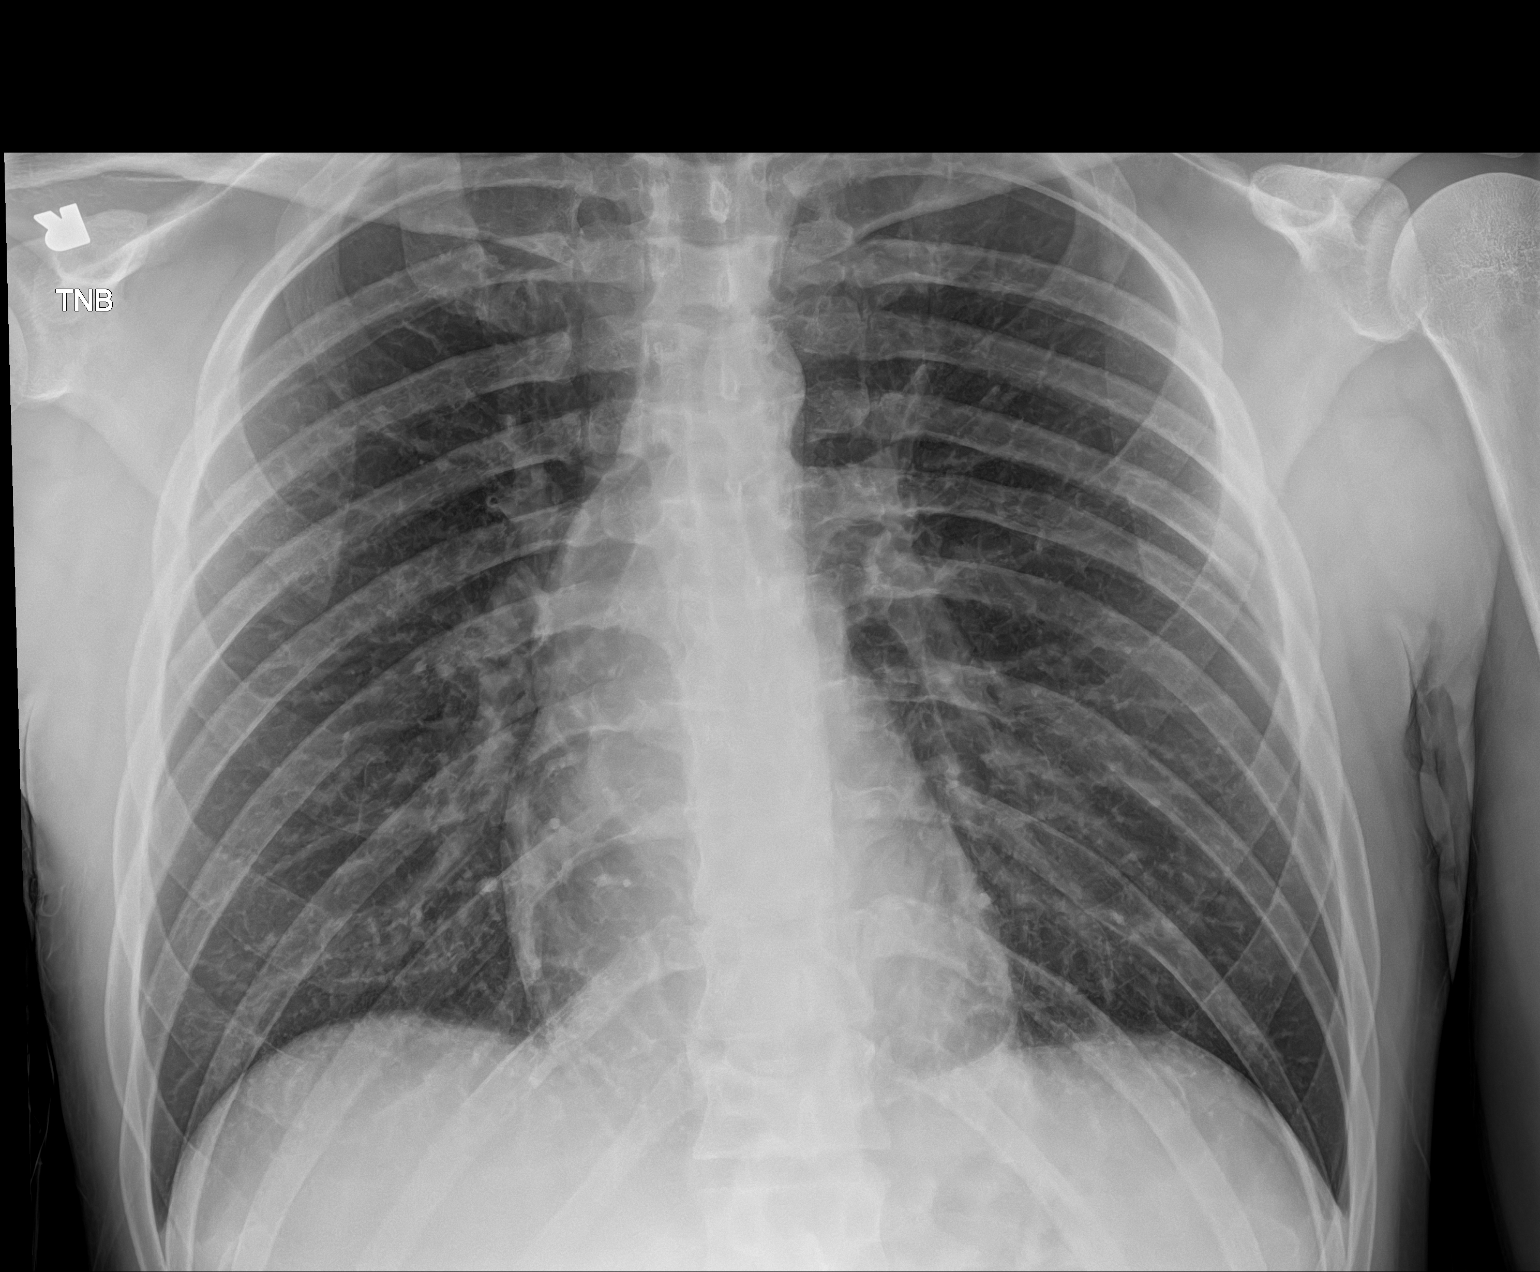

[1 of 1 positions shown; findings below may reference images not displayed]

FINDINGS: Heart size within normal limits. No appreciable airspace
consolidation. No evidence of pleural effusion or pneumothorax. No
acute bony abnormality identified. No evidence
IMPRESSION: No evidence of active cardiopulmonary disease.

## 2021-12-02 ENCOUNTER — Inpatient Hospital Stay (HOSPITAL_COMMUNITY)
Admission: EM | Admit: 2021-12-02 | Discharge: 2021-12-04 | DRG: 552 | Disposition: A | Discharge status: Home / Self Care | Payer: Self-pay | Attending: Neurosurgery | Admitting: Neurosurgery

## 2021-12-02 ENCOUNTER — Emergency Department (HOSPITAL_COMMUNITY): Payer: Self-pay

## 2021-12-02 ENCOUNTER — Other Ambulatory Visit: Payer: Self-pay

## 2021-12-02 DIAGNOSIS — S0182XA Laceration with foreign body of other part of head, initial encounter: Secondary | ICD-10-CM | POA: Diagnosis present

## 2021-12-02 DIAGNOSIS — F17210 Nicotine dependence, cigarettes, uncomplicated: Secondary | ICD-10-CM | POA: Diagnosis present

## 2021-12-02 DIAGNOSIS — Z20822 Contact with and (suspected) exposure to covid-19: Secondary | ICD-10-CM | POA: Diagnosis present

## 2021-12-02 DIAGNOSIS — S0101XA Laceration without foreign body of scalp, initial encounter: Secondary | ICD-10-CM | POA: Diagnosis present

## 2021-12-02 DIAGNOSIS — Z79899 Other long term (current) drug therapy: Secondary | ICD-10-CM

## 2021-12-02 DIAGNOSIS — S22001A Stable burst fracture of unspecified thoracic vertebra, initial encounter for closed fracture: Secondary | ICD-10-CM

## 2021-12-02 DIAGNOSIS — Z91030 Bee allergy status: Secondary | ICD-10-CM | POA: Diagnosis not present

## 2021-12-02 DIAGNOSIS — Z7951 Long term (current) use of inhaled steroids: Secondary | ICD-10-CM

## 2021-12-02 DIAGNOSIS — S22009A Unspecified fracture of unspecified thoracic vertebra, initial encounter for closed fracture: Secondary | ICD-10-CM | POA: Diagnosis not present

## 2021-12-02 DIAGNOSIS — Y92410 Unspecified street and highway as the place of occurrence of the external cause: Secondary | ICD-10-CM | POA: Diagnosis not present

## 2021-12-02 DIAGNOSIS — S01111A Laceration without foreign body of right eyelid and periocular area, initial encounter: Secondary | ICD-10-CM | POA: Diagnosis present

## 2021-12-02 DIAGNOSIS — Z23 Encounter for immunization: Secondary | ICD-10-CM

## 2021-12-02 DIAGNOSIS — S22069A Unspecified fracture of T7-T8 vertebra, initial encounter for closed fracture: Principal | ICD-10-CM | POA: Diagnosis present

## 2021-12-02 DIAGNOSIS — S22068A Other fracture of T7-T8 thoracic vertebra, initial encounter for closed fracture: Secondary | ICD-10-CM

## 2021-12-02 DIAGNOSIS — S22079A Unspecified fracture of T9-T10 vertebra, initial encounter for closed fracture: Secondary | ICD-10-CM | POA: Diagnosis present

## 2021-12-02 DIAGNOSIS — S0181XA Laceration without foreign body of other part of head, initial encounter: Secondary | ICD-10-CM | POA: Diagnosis present

## 2021-12-02 DIAGNOSIS — Z91048 Other nonmedicinal substance allergy status: Secondary | ICD-10-CM

## 2021-12-02 DIAGNOSIS — S22078A Other fracture of T9-T10 vertebra, initial encounter for closed fracture: Secondary | ICD-10-CM

## 2021-12-02 LAB — I-STAT CHEM 8, ED
BUN: 9 mg/dL (ref 6–20)
Calcium, Ion: 0.97 mmol/L — ABNORMAL LOW (ref 1.15–1.40)
Chloride: 102 mmol/L (ref 98–111)
Creatinine, Ser: 0.8 mg/dL (ref 0.61–1.24)
Glucose, Bld: 128 mg/dL — ABNORMAL HIGH (ref 70–99)
HCT: 44 % (ref 39.0–52.0)
Hemoglobin: 15 g/dL (ref 13.0–17.0)
Potassium: 3.9 mmol/L (ref 3.5–5.1)
Sodium: 141 mmol/L (ref 135–145)
TCO2: 28 mmol/L (ref 22–32)

## 2021-12-02 LAB — RESP PANEL BY RT-PCR (FLU A&B, COVID) ARPGX2
Influenza A by PCR: NEGATIVE
Influenza B by PCR: NEGATIVE
SARS Coronavirus 2 by RT PCR: NEGATIVE

## 2021-12-02 LAB — URINALYSIS, ROUTINE W REFLEX MICROSCOPIC
Bilirubin Urine: NEGATIVE
Glucose, UA: NEGATIVE mg/dL
Hgb urine dipstick: NEGATIVE
Ketones, ur: NEGATIVE mg/dL
Leukocytes,Ua: NEGATIVE
Nitrite: NEGATIVE
Protein, ur: NEGATIVE mg/dL
Specific Gravity, Urine: >1.046 — ABNORMAL HIGH (ref 1.005–1.030)
pH: 5 (ref 5.0–8.0)

## 2021-12-02 LAB — CBC
HCT: 44.8 % (ref 39.0–52.0)
Hemoglobin: 15.5 g/dL (ref 13.0–17.0)
MCH: 32.9 pg (ref 26.0–34.0)
MCHC: 34.6 g/dL (ref 30.0–36.0)
MCV: 95.1 fL (ref 80.0–100.0)
Platelets: 188 10*3/uL (ref 150–400)
RBC: 4.71 MIL/uL (ref 4.22–5.81)
RDW: 11.9 % (ref 11.5–15.5)
WBC: 11.6 10*3/uL — ABNORMAL HIGH (ref 4.0–10.5)
nRBC: 0 % (ref 0.0–0.2)

## 2021-12-02 LAB — COMPREHENSIVE METABOLIC PANEL
ALT: 21 U/L (ref 0–44)
AST: 26 U/L (ref 15–41)
Albumin: 3.6 g/dL (ref 3.5–5.0)
Alkaline Phosphatase: 82 U/L (ref 38–126)
Anion gap: 5 (ref 5–15)
BUN: 8 mg/dL (ref 6–20)
CO2: 27 mmol/L (ref 22–32)
Calcium: 8.6 mg/dL — ABNORMAL LOW (ref 8.9–10.3)
Chloride: 107 mmol/L (ref 98–111)
Creatinine, Ser: 0.95 mg/dL (ref 0.61–1.24)
GFR, Estimated: >60 mL/min (ref >60)
Glucose, Bld: 134 mg/dL — ABNORMAL HIGH (ref 70–99)
Potassium: 3.8 mmol/L (ref 3.5–5.1)
Sodium: 139 mmol/L (ref 135–145)
Total Bilirubin: 0.6 mg/dL (ref 0.3–1.2)
Total Protein: 5.8 g/dL — ABNORMAL LOW (ref 6.5–8.1)

## 2021-12-02 LAB — ETHANOL: Alcohol, Ethyl (B): <10 mg/dL (ref <10)

## 2021-12-02 LAB — PROTIME-INR
INR: 1 (ref 0.8–1.2)
Prothrombin Time: 12.8 seconds (ref 11.4–15.2)

## 2021-12-02 LAB — SAMPLE TO BLOOD BANK

## 2021-12-02 LAB — HIV ANTIBODY (ROUTINE TESTING W REFLEX): HIV Screen 4th Generation wRfx: NONREACTIVE

## 2021-12-02 LAB — LACTIC ACID, PLASMA: Lactic Acid, Venous: 1.7 mmol/L (ref 0.5–1.9)

## 2021-12-02 MED ORDER — SODIUM CHLORIDE 0.9 % IV BOLUS
1000.0000 mL | Freq: Once | INTRAVENOUS | Status: AC
  Administered 2021-12-02: 1000 mL via INTRAVENOUS

## 2021-12-02 MED ORDER — PANTOPRAZOLE SODIUM 40 MG IV SOLR
40.0000 mg | Freq: Every day | INTRAVENOUS | Status: DC
  Filled 2021-12-02: qty 10

## 2021-12-02 MED ORDER — HYDROMORPHONE HCL 1 MG/ML IJ SOLN
1.0000 mg | INTRAMUSCULAR | Status: DC | PRN
  Administered 2021-12-02 – 2021-12-04 (×11): 1 mg via INTRAVENOUS
  Filled 2021-12-02 (×11): qty 1

## 2021-12-02 MED ORDER — SODIUM CHLORIDE 0.9 % IV SOLN: INTRAVENOUS | Status: DC

## 2021-12-02 MED ORDER — HYDROMORPHONE HCL 1 MG/ML IJ SOLN
0.5000 mg | INTRAMUSCULAR | Status: DC | PRN
  Administered 2021-12-02: 0.5 mg via INTRAVENOUS
  Filled 2021-12-02: qty 1

## 2021-12-02 MED ORDER — METHOCARBAMOL 500 MG PO TABS
500.0000 mg | ORAL_TABLET | Freq: Three times a day (TID) | ORAL | Status: DC | PRN
  Administered 2021-12-03 – 2021-12-04 (×4): 500 mg via ORAL
  Filled 2021-12-02 (×4): qty 1

## 2021-12-02 MED ORDER — ONDANSETRON 4 MG PO TBDP: 4.0000 mg | ORAL_TABLET | Freq: Four times a day (QID) | ORAL | Status: DC | PRN

## 2021-12-02 MED ORDER — HYDROCODONE-ACETAMINOPHEN 5-325 MG PO TABS
1.0000 | ORAL_TABLET | ORAL | Status: DC | PRN
  Administered 2021-12-02 – 2021-12-04 (×5): 1 via ORAL
  Filled 2021-12-02 (×5): qty 1

## 2021-12-02 MED ORDER — PANTOPRAZOLE SODIUM 40 MG PO TBEC
40.0000 mg | DELAYED_RELEASE_TABLET | Freq: Every day | ORAL | Status: DC
  Administered 2021-12-02 – 2021-12-04 (×3): 40 mg via ORAL
  Filled 2021-12-02 (×3): qty 1

## 2021-12-02 MED ORDER — ACETAMINOPHEN 325 MG PO TABS: 650.0000 mg | ORAL_TABLET | ORAL | Status: DC | PRN

## 2021-12-02 MED ORDER — FENTANYL CITRATE PF 50 MCG/ML IJ SOSY
100.0000 ug | PREFILLED_SYRINGE | Freq: Once | INTRAMUSCULAR | Status: AC
  Administered 2021-12-02: 100 ug via INTRAVENOUS
  Filled 2021-12-02: qty 2

## 2021-12-02 MED ORDER — HYDROMORPHONE HCL 1 MG/ML IJ SOLN
1.0000 mg | Freq: Once | INTRAMUSCULAR | Status: AC
  Administered 2021-12-02: 1 mg via INTRAVENOUS
  Filled 2021-12-02: qty 1

## 2021-12-02 MED ORDER — TETANUS-DIPHTH-ACELL PERTUSSIS 5-2.5-18.5 LF-MCG/0.5 IM SUSY
0.5000 mL | PREFILLED_SYRINGE | Freq: Once | INTRAMUSCULAR | Status: AC
  Administered 2021-12-02: 0.5 mL via INTRAMUSCULAR
  Filled 2021-12-02: qty 0.5

## 2021-12-02 MED ORDER — IOHEXOL 350 MG/ML SOLN
100.0000 mL | Freq: Once | INTRAVENOUS | Status: AC | PRN
Start: 2021-12-02 — End: 2021-12-02
  Administered 2021-12-02: 100 mL via INTRAVENOUS

## 2021-12-02 MED ORDER — ONDANSETRON HCL 4 MG/2ML IJ SOLN
4.0000 mg | Freq: Four times a day (QID) | INTRAMUSCULAR | Status: DC | PRN
  Administered 2021-12-02 – 2021-12-03 (×3): 4 mg via INTRAVENOUS
  Filled 2021-12-02 (×3): qty 2

## 2021-12-02 MED ORDER — METHOCARBAMOL 1000 MG/10ML IJ SOLN
500.0000 mg | Freq: Three times a day (TID) | INTRAVENOUS | Status: DC | PRN
  Administered 2021-12-03: 500 mg via INTRAVENOUS
  Filled 2021-12-02 (×4): qty 5

## 2021-12-02 MED ORDER — LIDOCAINE-EPINEPHRINE (PF) 2 %-1:200000 IJ SOLN
10.0000 mL | Freq: Once | INTRAMUSCULAR | Status: AC
  Administered 2021-12-02: 10 mL via INTRADERMAL
  Filled 2021-12-02: qty 20

## 2021-12-02 MED ORDER — DOCUSATE SODIUM 100 MG PO CAPS
100.0000 mg | ORAL_CAPSULE | Freq: Two times a day (BID) | ORAL | Status: DC
  Administered 2021-12-02 – 2021-12-04 (×4): 100 mg via ORAL
  Filled 2021-12-02 (×4): qty 1
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
## 2021-12-02 NOTE — ED Notes (Signed)
Received verbal report from Louie C RN at this time ?

## 2021-12-02 NOTE — ED Notes (Signed)
Sister Aycen Bleeker [phone] would like an update asap ?

## 2021-12-02 NOTE — Progress Notes (Signed)
Orthopedic Tech Progress Note ?Patient Details:  ?Benjamin Reyes ?[birth year] ?[id number] ?TLSO Brace has been delivered to the patient's room.  ?Patient ID: Benjamin Reyes, male   DOB: 1991/06/13, 31 y.o.   MRN: [id number] ? ?Benjamin Reyes ?12/02/2021, 11:57 AM ? ?
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
## 2021-12-03 ENCOUNTER — Inpatient Hospital Stay (HOSPITAL_COMMUNITY): Payer: Self-pay

## 2021-12-03 LAB — CBC
HCT: 41.6 % (ref 39.0–52.0)
Hemoglobin: 15 g/dL (ref 13.0–17.0)
MCH: 34 pg (ref 26.0–34.0)
MCHC: 36.1 g/dL — ABNORMAL HIGH (ref 30.0–36.0)
MCV: 94.3 fL (ref 80.0–100.0)
Platelets: 168 10*3/uL (ref 150–400)
RBC: 4.41 MIL/uL (ref 4.22–5.81)
RDW: 12 % (ref 11.5–15.5)
WBC: 12.3 10*3/uL — ABNORMAL HIGH (ref 4.0–10.5)
nRBC: 0 % (ref 0.0–0.2)

## 2021-12-03 LAB — BASIC METABOLIC PANEL
Anion gap: 6 (ref 5–15)
BUN: 7 mg/dL (ref 6–20)
CO2: 25 mmol/L (ref 22–32)
Calcium: 8.6 mg/dL — ABNORMAL LOW (ref 8.9–10.3)
Chloride: 106 mmol/L (ref 98–111)
Creatinine, Ser: 0.67 mg/dL (ref 0.61–1.24)
GFR, Estimated: >60 mL/min (ref >60)
Glucose, Bld: 104 mg/dL — ABNORMAL HIGH (ref 70–99)
Potassium: 3.8 mmol/L (ref 3.5–5.1)
Sodium: 137 mmol/L (ref 135–145)

## 2021-12-03 NOTE — Plan of Care (Signed)
  Problem: Education: Goal: Knowledge of General Education information will improve Description Including pain rating scale, medication(s)/side effects and non-pharmacologic comfort measures Outcome: Progressing   
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
## 2021-12-03 NOTE — ED Notes (Signed)
Pt c/o nausea and started having multiple episodes of vomiting after pain meds were administered. Pt provided vomit bag and zofran for the nausea and vomiting ?
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
## 2021-12-03 NOTE — Plan of Care (Signed)
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
## 2021-12-04 MED ORDER — HYDROCODONE-ACETAMINOPHEN 5-325 MG PO TABS: 1.0000 | ORAL_TABLET | ORAL | 0 refills | Status: DC | PRN

## 2021-12-04 MED ORDER — DOCUSATE SODIUM 100 MG PO CAPS: 100.0000 mg | ORAL_CAPSULE | Freq: Two times a day (BID) | ORAL | 0 refills | Status: AC

## 2021-12-04 MED ORDER — METHOCARBAMOL 500 MG PO TABS: 500.0000 mg | ORAL_TABLET | Freq: Four times a day (QID) | ORAL | 1 refills | Status: DC | PRN

## 2021-12-04 NOTE — Plan of Care (Signed)
?Problem: Education: ?Goal: Knowledge of General Education information will improve ?Description: Including pain rating scale, medication(s)/side effects and non-pharmacologic comfort measures ?12/04/2021 1134 by Coy Saunas, RN ?Outcome: Adequate for Discharge ?12/04/2021 1103 by Coy Saunas, RN ?Outcome: Adequate for Discharge ?  ?Problem: Health Behavior/Discharge Planning: ?Goal: Ability to manage health-related needs will improve ?12/04/2021 1134 by Coy Saunas, RN ?Outcome: Adequate for Discharge ?12/04/2021 1103 by Coy Saunas, RN ?Outcome: Adequate for Discharge ?  ?Problem: Clinical Measurements: ?Goal: Ability to maintain clinical measurements within normal limits will improve ?12/04/2021 1134 by Coy Saunas, RN ?Outcome: Adequate for Discharge ?12/04/2021 1103 by Coy Saunas, RN ?Outcome: Adequate for Discharge ?Goal: Will remain free from infection ?12/04/2021 1134 by Coy Saunas, RN ?Outcome: Adequate for Discharge ?12/04/2021 1103 by Coy Saunas, RN ?Outcome: Adequate for Discharge ?Goal: Diagnostic test results will improve ?12/04/2021 1134 by Coy Saunas, RN ?Outcome: Adequate for Discharge ?12/04/2021 1103 by Coy Saunas, RN ?Outcome: Adequate for Discharge ?Goal: Respiratory complications will improve ?12/04/2021 1134 by Coy Saunas, RN ?Outcome: Adequate for Discharge ?12/04/2021 1103 by Coy Saunas, RN ?Outcome: Adequate for Discharge ?Goal: Cardiovascular complication will be avoided ?12/04/2021 1134 by Coy Saunas, RN ?Outcome: Adequate for Discharge ?12/04/2021 1103 by Coy Saunas, RN ?Outcome: Adequate for Discharge ?  ?Problem: Activity: ?Goal: Risk for activity intolerance will decrease ?12/04/2021 1134 by Coy Saunas, RN ?Outcome: Adequate for Discharge ?12/04/2021 1103 by Coy Saunas, RN ?Outcome: Adequate for Discharge ?  ?Problem: Nutrition: ?Goal: Adequate nutrition will be maintained ?12/04/2021 1134 by Coy Saunas, RN ?Outcome: Adequate for  Discharge ?12/04/2021 1103 by Coy Saunas, RN ?Outcome: Adequate for Discharge ?  ?Problem: Coping: ?Goal: Level of anxiety will decrease ?12/04/2021 1134 by Coy Saunas, RN ?Outcome: Adequate for Discharge ?12/04/2021 1103 by Coy Saunas, RN ?Outcome: Adequate for Discharge ?  ?Problem: Elimination: ?Goal: Will not experience complications related to bowel motility ?12/04/2021 1134 by Coy Saunas, RN ?Outcome: Adequate for Discharge ?12/04/2021 1103 by Coy Saunas, RN ?Outcome: Adequate for Discharge ?Goal: Will not experience complications related to urinary retention ?12/04/2021 1134 by Coy Saunas, RN ?Outcome: Adequate for Discharge ?12/04/2021 1103 by Coy Saunas, RN ?Outcome: Adequate for Discharge ?  ?Problem: Pain Managment: ?Goal: General experience of comfort will improve ?12/04/2021 1134 by Coy Saunas, RN ?Outcome: Adequate for Discharge ?12/04/2021 1103 by Coy Saunas, RN ?Outcome: Adequate for Discharge ?  ?Problem: Safety: ?Goal: Ability to remain free from injury will improve ?12/04/2021 1134 by Coy Saunas, RN ?Outcome: Adequate for Discharge ?12/04/2021 1103 by Coy Saunas, RN ?Outcome: Adequate for Discharge ?  ?Problem: Skin Integrity: ?Goal: Risk for impaired skin integrity will decrease ?12/04/2021 1134 by Coy Saunas, RN ?Outcome: Adequate for Discharge ?12/04/2021 1103 by Coy Saunas, RN ?Outcome: Adequate for Discharge ?  ?Problem: Acute Rehab PT Goals(only PT should resolve) ?Goal: Pt Will Go Supine/Side To Sit ?Outcome: Adequate for Discharge ?Goal: Pt Will Go Sit To Supine/Side ?Outcome: Adequate for Discharge ?Goal: Patient Will Transfer Sit To/From Stand ?Outcome: Adequate for Discharge ?Goal: Pt Will Ambulate ?Outcome: Adequate for Discharge ?Goal: Pt Will Go Up/Down Stairs ?Outcome: Adequate for Discharge ?  ?Problem: Acute Rehab OT Goals (only OT should resolve) ?Goal: Pt. Will Perform Grooming ?Outcome: Adequate for Discharge ?Goal: Pt. Will Perform Upper  Body Dressing ?Outcome: Adequate for Discharge ?Goal: Pt. Will Perform Lower Body Dressing ?Outcome: Adequate for Discharge ?  Goal: Pt. Will Transfer To Toilet ?Outcome: Adequate for Discharge ?Goal: Pt. Will Perform Toileting-Clothing Manipulation ?Outcome: Adequate for Discharge ?Goal: Pt. Will Perform Tub/Shower Transfer ?Outcome: Adequate for Discharge ?  ?

## 2021-12-04 NOTE — TOC CAGE-AID Note (Signed)
Transition of Care (TOC) - CAGE-AID Screening ? ? ?Patient Details  ?Name: Benjamin Reyes ?MRN: [id number] ?Date of Birth: 10/07/1990 ? ?Transition of Care (TOC) CM/SW Contact:    ?Ronnae Kaser C Tarpley-Carter, LCSWA ?Phone Number: ?12/04/2021, 1:14 PM ? ? ?Clinical Narrative: ?Pt participated in Cage-Aid.  Pt stated he does smoke cigarettes.  Pt was offered resources, due to usage of substance.    ? ?Insurance underwriter, MSW, LCSW-A ?Pronouns:  She/Her/Hers ?[facility] ?Clinical Social Worker ?Direct Number:  [phone] ?[email or web address] ? ?CAGE-AID Screening: ?  ? ?Have You Ever Felt You Ought to Cut Down on Your Drinking or Drug Use?: Yes ?Have People Annoyed You By Critizing Your Drinking Or Drug Use?: No ?Have You Felt Bad Or Guilty About Your Drinking Or Drug Use?: No ?Have You Ever Had a Drink or Used Drugs First Thing In The Morning to Steady Your Nerves or to Get Rid of a Hangover?: No ?CAGE-AID Score: 1 ? ?Substance Abuse Education Offered: Yes ? ?Substance abuse interventions: Educational Materials ? ? ? ? ? ? ?

## 2021-12-04 NOTE — Plan of Care (Signed)

## 2021-12-04 NOTE — TOC Transition Note (Addendum)
Transition of Care (TOC) - CM/SW Discharge Note ? ? ?Patient Details  ?Name: Benjamin Reyes ?MRN: [id number] ?Date of Birth: [birth year] ? ?Transition of Care (TOC) CM/SW Contact:  ?Bess Kinds, RN ?Phone Number: [phone] ?12/04/2021, 12:30 PM ? ? ?Clinical Narrative:    ? ?Notified by nursing that patient is ready to transition home pending delivery of DME. Referral sent to [facility] for delivery to the room. No further TOC needs identified at this time.  ? ?Update 13:00 - Notified by [facility] that DME was an out of pocket cost of $80. Spoke to patient on the hospital phone - patient declined DME stating he had a walker that he could borrow.  ? ?Final next level of care: Home/Self Care ?Barriers to Discharge: No Barriers Identified ? ? ?Patient Goals and CMS Choice ?Patient states their goals for this hospitalization and ongoing recovery are:: return home ?CMS Medicare.gov Compare Post Acute Care list provided to:: Patient ?Choice offered to / list presented to : Patient ? ?Discharge Placement ?  ?           ?  ?  ?  ?  ? ?Discharge Plan and Services ?  ?  ?           ?DME Arranged: 3-N-1, Walker rolling ?DME Agency: [facility] ?Date DME Agency Contacted: 12/04/21 ?Time DME Agency Contacted: 1229 ?Representative spoke with at DME Agency: Leavy Cella ?HH Arranged: NA ?HH Agency: NA ?  ?  ?  ? ?Social Determinants of Health (SDOH) Interventions ?  ? ? ?Readmission Risk Interventions ?   ? View : No data to display.  ?  ?  ?  ? ? ? ? ? ?
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
## 2021-12-04 NOTE — Progress Notes (Signed)
PIV removed. Discharge instructions completed. Patient verbalized understanding of medication regimen, follow up appointments and discharge instructions. Patient belongings gathered and packed to discharge.  

## 2021-12-04 NOTE — Discharge Instructions (Addendum)
Activity ?Walk each and every day, increasing distance each day. ?No lifting greater than 5 lbs.  Avoid excessive back motion. ?No driving for 2 weeks; may ride as a passenger locally. ? ?Diet ?Resume your normal diet. ?  ?Return to Work ?Will be discussed at your follow up appointment. ? ? ?Follow Up Appt ?Call [phone] today for appointment in 2-3 weeks if you don't already have one or for any problems.  ?

## 2021-12-04 NOTE — Progress Notes (Signed)
Patient IV removed and he is ready to leave with his sister and mom after his equipment arrives. ?
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
## 2021-12-04 NOTE — Progress Notes (Signed)
Patient given discharge instructions and stated understanding. 
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
## 2023-02-11 ENCOUNTER — Emergency Department (HOSPITAL_COMMUNITY): Payer: Medicaid Other

## 2023-02-11 ENCOUNTER — Encounter (HOSPITAL_COMMUNITY): Payer: Self-pay | Admitting: Emergency Medicine

## 2023-02-11 ENCOUNTER — Other Ambulatory Visit: Payer: Self-pay

## 2023-02-11 ENCOUNTER — Observation Stay (HOSPITAL_COMMUNITY)
Admission: EM | Admit: 2023-02-11 | Discharge: 2023-02-12 | Disposition: A | Discharge status: Home / Self Care | Payer: Medicaid Other | Attending: Neurological Surgery | Admitting: Neurological Surgery

## 2023-02-11 DIAGNOSIS — Z79899 Other long term (current) drug therapy: Secondary | ICD-10-CM | POA: Insufficient documentation

## 2023-02-11 DIAGNOSIS — Y9316 Activity, rowing, canoeing, kayaking, rafting and tubing: Secondary | ICD-10-CM | POA: Diagnosis not present

## 2023-02-11 DIAGNOSIS — W208XXA Other cause of strike by thrown, projected or falling object, initial encounter: Secondary | ICD-10-CM | POA: Diagnosis not present

## 2023-02-11 DIAGNOSIS — G930 Cerebral cysts: Principal | ICD-10-CM | POA: Insufficient documentation

## 2023-02-11 DIAGNOSIS — R200 Anesthesia of skin: Secondary | ICD-10-CM | POA: Diagnosis present

## 2023-02-11 DIAGNOSIS — F17200 Nicotine dependence, unspecified, uncomplicated: Secondary | ICD-10-CM | POA: Diagnosis not present

## 2023-02-11 DIAGNOSIS — S065XAA Traumatic subdural hemorrhage with loss of consciousness status unknown, initial encounter: Principal | ICD-10-CM | POA: Diagnosis present

## 2023-02-11 LAB — CBC WITH DIFFERENTIAL/PLATELET
Abs Immature Granulocytes: 0.02 10*3/uL (ref 0.00–0.07)
Basophils Absolute: 0 10*3/uL (ref 0.0–0.1)
Basophils Relative: 1 %
Eosinophils Absolute: 0.2 10*3/uL (ref 0.0–0.5)
Eosinophils Relative: 3 %
HCT: 47.2 % (ref 39.0–52.0)
Hemoglobin: 16.8 g/dL (ref 13.0–17.0)
Immature Granulocytes: 0 %
Lymphocytes Relative: 35 %
Lymphs Abs: 2.4 10*3/uL (ref 0.7–4.0)
MCH: 33.8 pg (ref 26.0–34.0)
MCHC: 35.6 g/dL (ref 30.0–36.0)
MCV: 95 fL (ref 80.0–100.0)
Monocytes Absolute: 0.5 10*3/uL (ref 0.1–1.0)
Monocytes Relative: 7 %
Neutro Abs: 3.7 10*3/uL (ref 1.7–7.7)
Neutrophils Relative %: 54 %
Platelets: 201 10*3/uL (ref 150–400)
RBC: 4.97 MIL/uL (ref 4.22–5.81)
RDW: 12.9 % (ref 11.5–15.5)
WBC: 6.9 10*3/uL (ref 4.0–10.5)
nRBC: 0 % (ref 0.0–0.2)

## 2023-02-11 LAB — COMPREHENSIVE METABOLIC PANEL
ALT: 14 U/L (ref 0–44)
AST: 13 U/L — ABNORMAL LOW (ref 15–41)
Albumin: 3.6 g/dL (ref 3.5–5.0)
Alkaline Phosphatase: 125 U/L (ref 38–126)
Anion gap: 7 (ref 5–15)
BUN: 9 mg/dL (ref 6–20)
CO2: 28 mmol/L (ref 22–32)
Calcium: 8.7 mg/dL — ABNORMAL LOW (ref 8.9–10.3)
Chloride: 104 mmol/L (ref 98–111)
Creatinine, Ser: 0.81 mg/dL (ref 0.61–1.24)
GFR, Estimated: >60 mL/min (ref >60)
Glucose, Bld: 97 mg/dL (ref 70–99)
Potassium: 3.4 mmol/L — ABNORMAL LOW (ref 3.5–5.1)
Sodium: 139 mmol/L (ref 135–145)
Total Bilirubin: 0.6 mg/dL (ref 0.3–1.2)
Total Protein: 6.7 g/dL (ref 6.5–8.1)
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
## 2023-02-11 NOTE — ED Provider Notes (Incomplete)
[place] [facility]MENT AT [facility] Provider Note   CSN: [id number] Arrival date & time: 02/11/23  1244     History {Add pertinent medical, surgical, social history, OB history to HPI:1} Chief Complaint  Patient presents with   Numbness    Benjamin Reyes is a 32 y.o. male.  Patient states that he slept in a car last night and when he awoke he was unable to move his right leg and had numbness from his mid chest all the way down to his leg.  Patient states his symptoms are improving now.  She also states that he was rafting and water and hit his buttocks very hard on a rock and has been having pain ever since then.  The history is provided by the patient and medical records. No language interpreter was used.  Weakness Severity:  Mild Onset quality:  Sudden Timing:  Constant Progression:  Waxing and waning Chronicity:  New Context: not alcohol use   Relieved by:  Nothing Worsened by:  Nothing Ineffective treatments:  None tried Associated symptoms: no abdominal pain, no chest pain, no cough, no diarrhea, no frequency, no headaches and no seizures        Home Medications Prior to Admission medications   Medication Sig Start Date End Date Taking? Authorizing Provider  docusate sodium (COLACE) 100 MG capsule Take 1 capsule (100 mg total) by mouth 2 (two) times daily. 12/04/21   Val Eagle D, NP  HYDROcodone-acetaminophen (NORCO/VICODIN) 5-325 MG tablet Take 1 tablet by mouth every 4 (four) hours as needed for moderate pain. 12/04/21   Val Eagle D, NP  hydrocortisone cream 1 % Apply to affected area 2 times daily Patient not taking: Reported on 12/02/2021 12/31/18   Raeford Razor, MD  ibuprofen (ADVIL,MOTRIN) 600 MG tablet Take 1 tablet (600 mg total) by mouth every 6 (six) hours as needed. 08/23/14   Linwood Dibbles, MD  methocarbamol (ROBAXIN) 500 MG tablet Take 1 tablet (500 mg total) by mouth every 6 (six) hours as needed for muscle spasms. 12/04/21    Val Eagle D, NP      Allergies    Poison ivy extract and Bee venom    Review of Systems   Review of Systems  Constitutional:  Negative for appetite change and fatigue.  HENT:  Negative for congestion, ear discharge and sinus pressure.   Eyes:  Negative for discharge.  Respiratory:  Negative for cough.   Cardiovascular:  Negative for chest pain.  Gastrointestinal:  Negative for abdominal pain and diarrhea.  Genitourinary:  Negative for frequency and hematuria.  Musculoskeletal:  Negative for back pain.  Skin:  Negative for rash.  Neurological:  Positive for weakness. Negative for seizures and headaches.  Psychiatric/Behavioral:  Negative for hallucinations.     Physical Exam Updated Vital Signs BP 112/68   Pulse 66   Temp 97.9 F (36.6 C) (Oral)   Resp 18   Ht 6\' 3"  (1.905 m)   Wt 77.1 kg   SpO2 96%   BMI 21.25 kg/m  Physical Exam Vitals and nursing note reviewed.  Constitutional:      Appearance: He is well-developed.  HENT:     Head: Normocephalic.     Nose: Nose normal.  Eyes:     General: No scleral icterus.    Conjunctiva/sclera: Conjunctivae normal.  Neck:     Thyroid: No thyromegaly.  Cardiovascular:     Rate and Rhythm: Normal rate and regular rhythm.     Heart sounds:  No murmur heard.    No friction rub. No gallop.  Pulmonary:     Breath sounds: No stridor. No wheezing or rales.  Chest:     Chest wall: No tenderness.  Abdominal:     General: There is no distension.     Tenderness: There is no abdominal tenderness. There is no rebound.  Musculoskeletal:        General: Normal range of motion.     Cervical back: Neck supple.     Comments: Mild weakness and numbness in right leg.  Lymphadenopathy:     Cervical: No cervical adenopathy.  Skin:    Findings: No erythema or rash.  Neurological:     Mental Status: He is alert and oriented to person, place, and time.     Motor: No abnormal muscle tone.     Coordination: Coordination normal.   Psychiatric:        Behavior: Behavior normal.     ED Results / Procedures / Treatments   Labs (all labs ordered are listed, but only abnormal results are displayed) Labs Reviewed  CBC WITH DIFFERENTIAL/PLATELET  COMPREHENSIVE METABOLIC PANEL    EKG None  Radiology MR LUMBAR SPINE WO CONTRAST  Result Date: 02/11/2023 CLINICAL DATA:  Neuro deficit, acute, stroke suspected; Low back pain, infection suspected, positive xray/CT EXAM: MRI HEAD WITHOUT CONTRAST MRI LUMBAR SPINE WITHOUT CONTRAST TECHNIQUE: Multiplanar, multiecho pulse sequences of the brain and surrounding structures, and lumbar spine, to include the craniocervical junction and cervicothoracic junction, were obtained without intravenous contrast. COMPARISON:  CT head December 02, 2021. FINDINGS: MRI HEAD FINDINGS Brain: Interval development of a large CSF intensity subdural fluid collection along the left cerebral hemisphere which measures up to 2.1 cm with resulting new mass effect and 7 mm of rightward midline shift. This collection communicates with the arachnoid cyst along the anterior aspect left middle cranial fossa. No evidence of acute infarct, mass lesion or hydrocephalus. Effacement left lateral ventricle. Vascular: Major arterial flow voids are maintained. Skull and upper cervical spine: Normal marrow signal. Sinuses/Orbits: Left maxillary sinus retention cyst. No acute orbital findings. MRI LUMBAR SPINE FINDINGS Alignment: No substantial sagittal subluxation. Vertebrae: Remote superior endplate deformity at T12, likely due to prior fracture or Schmorl's node. No marrow edema to suggest acute fracture or discitis/osteomyelitis. No suspicious bone lesions. Degenerative/discogenic endplate signal changes at L5-S1. No specific evidence of discitis/osteomyelitis. Cord: Normal cord signal. Posterior Fossa, vertebral arteries, paraspinal tissues: No visible acute abnormality. Motion limited. Disc levels: T12-L1: No significant disc  protrusion, foraminal stenosis, or canal stenosis. L1-L2: No significant disc protrusion, foraminal stenosis, or canal stenosis. L2-L3: No significant disc protrusion, foraminal stenosis, or canal stenosis. L3-L4: No significant disc protrusion, foraminal stenosis, or canal stenosis. L4-L5: No significant disc protrusion, foraminal stenosis, or canal stenosis. L5-S1: Degenerative disc disease with disc height loss and desiccation. Endplate spurring, centric to the left. Resulting mild bilateral foraminal stenosis. Patent canal. Mild left subarticular recess narrowing. IMPRESSION: MRI head: Since December 02, 2021, interval development of a large CSF intensity subdural fluid collection along the left cerebral hemisphere which measures up to 2.1 cm and communicates with the arachnoid cyst along the anterior aspect left middle cranial fossa. Recommend neurosurgical consultation. MRI lumbar spine: 1. A superior endplate deformity at T12, likely due to prior fracture or degenerative Schmorl's node. No marrow edema to suggest acute fracture. 2. Degenerative disc disease at L5-S1 with mild bilateral foraminal stenosis and mild left subarticular recess narrowing. Electronically Signed   By: Gelene Mink  Barnett Applebaum M.D.   On: 02/11/2023 19:13   MR BRAIN WO CONTRAST  Result Date: 02/11/2023 CLINICAL DATA:  Neuro deficit, acute, stroke suspected; Low back pain, infection suspected, positive xray/CT EXAM: MRI HEAD WITHOUT CONTRAST MRI LUMBAR SPINE WITHOUT CONTRAST TECHNIQUE: Multiplanar, multiecho pulse sequences of the brain and surrounding structures, and lumbar spine, to include the craniocervical junction and cervicothoracic junction, were obtained without intravenous contrast. COMPARISON:  CT head December 02, 2021. FINDINGS: MRI HEAD FINDINGS Brain: Interval development of a large CSF intensity subdural fluid collection along the left cerebral hemisphere which measures up to 2.1 cm with resulting new mass effect and 7 mm of  rightward midline shift. This collection communicates with the arachnoid cyst along the anterior aspect left middle cranial fossa. No evidence of acute infarct, mass lesion or hydrocephalus. Effacement left lateral ventricle. Vascular: Major arterial flow voids are maintained. Skull and upper cervical spine: Normal marrow signal. Sinuses/Orbits: Left maxillary sinus retention cyst. No acute orbital findings. MRI LUMBAR SPINE FINDINGS Alignment: No substantial sagittal subluxation. Vertebrae: Remote superior endplate deformity at T12, likely due to prior fracture or Schmorl's node. No marrow edema to suggest acute fracture or discitis/osteomyelitis. No suspicious bone lesions. Degenerative/discogenic endplate signal changes at L5-S1. No specific evidence of discitis/osteomyelitis. Cord: Normal cord signal. Posterior Fossa, vertebral arteries, paraspinal tissues: No visible acute abnormality. Motion limited. Disc levels: T12-L1: No significant disc protrusion, foraminal stenosis, or canal stenosis. L1-L2: No significant disc protrusion, foraminal stenosis, or canal stenosis. L2-L3: No significant disc protrusion, foraminal stenosis, or canal stenosis. L3-L4: No significant disc protrusion, foraminal stenosis, or canal stenosis. L4-L5: No significant disc protrusion, foraminal stenosis, or canal stenosis. L5-S1: Degenerative disc disease with disc height loss and desiccation. Endplate spurring, centric to the left. Resulting mild bilateral foraminal stenosis. Patent canal. Mild left subarticular recess narrowing. IMPRESSION: MRI head: Since December 02, 2021, interval development of a large CSF intensity subdural fluid collection along the left cerebral hemisphere which measures up to 2.1 cm and communicates with the arachnoid cyst along the anterior aspect left middle cranial fossa. Recommend neurosurgical consultation. MRI lumbar spine: 1. A superior endplate deformity at T12, likely due to prior fracture or  degenerative Schmorl's node. No marrow edema to suggest acute fracture. 2. Degenerative disc disease at L5-S1 with mild bilateral foraminal stenosis and mild left subarticular recess narrowing. Electronically Signed   By: Feliberto Harts M.D.   On: 02/11/2023 19:13   CT Thoracic Spine Wo Contrast  Result Date: 02/11/2023 CLINICAL DATA:  Fall on a rock 9 days ago, numbness from mid chest down EXAM: CT THORACIC AND LUMBAR SPINE WITHOUT CONTRAST TECHNIQUE: Multidetector CT imaging of the thoracic and lumbar spine was performed without intravenous contrast administration. Multiplanar CT image reconstructions were also generated. RADIATION DOSE REDUCTION: This exam was performed according to the departmental dose-optimization program which includes automated exposure control, adjustment of the mA and/or kV according to patient size and/or use of iterative reconstruction technique. COMPARISON:  12/02/2021 FINDINGS: Segmentation: Normal. Twelve rib-bearing vertebral bodies and 5 lumbar type vertebral bodies. Alignment: Exaggerated lower thoracic kyphosis. Normal lumbar lordosis. Vertebrae: No acute fracture or focal pathologic process. Redemonstrated superior endplate wedge deformity of T9 as well as a nonacute anterior superior corner type fracture of T10, seen acutely on examination dated 12/02/2021, as well as a nonacute superior endplate deformity of T12 (series 5, image 33). Additional nonacute Schmorl type superior endplate deformity of T11. Disc spaces: Focal mild disc space height loss and osteophytosis of L5-S1 with  otherwise minimal disc degenerative change in the thoracic spine and preserved remaining lumbar disc spaces. Paraspinal and other soft tissues: Negative. IMPRESSION: 1. No acute fracture or dislocation of the thoracic or lumbar spine. 2. Redemonstrated superior endplate wedge deformity of T9 and nonacute anterior superior corner type fracture of T10, seen acutely on examination dated 12/02/2021,  as well as a nonacute superior endplate deformity of T12. Additional nonacute Schmorl type superior endplate deformity of T11. 3. Focal mild disc space height loss and osteophytosis of L5-S1 with otherwise minimal disc degenerative change in the thoracic spine and preserved remaining lumbar disc spaces. Electronically Signed   By: Jearld Lesch M.D.   On: 02/11/2023 16:14   CT Lumbar Spine Wo Contrast  Result Date: 02/11/2023 CLINICAL DATA:  Fall on a rock 9 days ago, numbness from mid chest down EXAM: CT THORACIC AND LUMBAR SPINE WITHOUT CONTRAST TECHNIQUE: Multidetector CT imaging of the thoracic and lumbar spine was performed without intravenous contrast administration. Multiplanar CT image reconstructions were also generated. RADIATION DOSE REDUCTION: This exam was performed according to the departmental dose-optimization program which includes automated exposure control, adjustment of the mA and/or kV according to patient size and/or use of iterative reconstruction technique. COMPARISON:  12/02/2021 FINDINGS: Segmentation: Normal. Twelve rib-bearing vertebral bodies and 5 lumbar type vertebral bodies. Alignment: Exaggerated lower thoracic kyphosis. Normal lumbar lordosis. Vertebrae: No acute fracture or focal pathologic process. Redemonstrated superior endplate wedge deformity of T9 as well as a nonacute anterior superior corner type fracture of T10, seen acutely on examination dated 12/02/2021, as well as a nonacute superior endplate deformity of T12 (series 5, image 33). Additional nonacute Schmorl type superior endplate deformity of T11. Disc spaces: Focal mild disc space height loss and osteophytosis of L5-S1 with otherwise minimal disc degenerative change in the thoracic spine and preserved remaining lumbar disc spaces. Paraspinal and other soft tissues: Negative. IMPRESSION: 1. No acute fracture or dislocation of the thoracic or lumbar spine. 2. Redemonstrated superior endplate wedge deformity of T9  and nonacute anterior superior corner type fracture of T10, seen acutely on examination dated 12/02/2021, as well as a nonacute superior endplate deformity of T12. Additional nonacute Schmorl type superior endplate deformity of T11. 3. Focal mild disc space height loss and osteophytosis of L5-S1 with otherwise minimal disc degenerative change in the thoracic spine and preserved remaining lumbar disc spaces. Electronically Signed   By: Jearld Lesch M.D.   On: 02/11/2023 16:14    Procedures Procedures  {Document cardiac monitor, telemetry assessment procedure when appropriate:1}  Medications Ordered in ED Medications - No data to display  ED Course/ Medical Decision Making/ A&P   {  CRITICAL CARE Performed by: Bethann Berkshire Total critical care time: 45 minutes Critical care time was exclusive of separately billable procedures and treating other patients. Critical care was necessary to treat or prevent imminent or life-threatening deterioration. Critical care was time spent personally by me on the following activities: development of treatment plan with patient and/or surrogate as well as nursing, discussions with consultants, evaluation of patient's response to treatment, examination of patient, obtaining history from patient or surrogate, ordering and performing treatments and interventions, ordering and review of laboratory studies, ordering and review of radiographic studies, pulse oximetry and re-evaluation of patient's condition. Patient initially complained of weakness in his right leg and numbness.  This has resolved. Click here for ABCD2, HEART and other calculatorsREFRESH Note before signing :1}  Medical Decision Making Amount and/or Complexity of Data Reviewed Labs: ordered. Radiology: ordered.  Risk Decision regarding hospitalization.   Patient with a subdural hematoma from unknown cause.  Dr. Johnsie Cancel will admit the patient over to [facility]  {Document critical care time when appropriate:1} {Document review of labs and clinical decision tools ie heart score, Chads2Vasc2 etc:1}  {Document your independent review of radiology images, and any outside records:1} {Document your discussion with family members, caretakers, and with consultants:1} {Document social determinants of health affecting pt's care:1} {Document your decision making why or why not admission, treatments were needed:1} Final Clinical Impression(s) / ED Diagnoses Final diagnoses:  None    Rx / DC Orders ED Discharge Orders     None

## 2023-02-12 NOTE — Discharge Summary (Signed)
Discharge Summary  Date of Admission: 02/11/2023  Date of Discharge: 02/12/23  Attending Physician: Autumn Patty, MD  Hospital Course: Patient was admitted as a transfer from outside facility ED. He presented with leg weakness and an MRI showed a possible ruptured middle fossa arachnoid cyst. Upon arrival to [facility], his weakness was completely resolved, his neurologic exam was normal, and his headache was minimal. He was therefore discharged home.   Discharge diagnosis: Middle fossa arachnoid cyst  Jadene Pierini, MD 02/12/23 7:24 AM

## 2023-02-12 NOTE — H&P (Signed)
Neurosurgery H&P  CC: Leg weakness  HPI: This is a 32 y.o. man that originally presented to outside hospital after a fall onto his rock, striking his right buttock. At OSH, he was complaining of right sided leg weakness and numbness. Incidentally, he noted he struck his head hard enough to lose consciousness a week or two ago and has had some headaches since then. The headaches are tolerable, holocephalic, no N/V, and the onset of the weakness was not temporally matched. No change in bowel/bladder function. Pt attributes the leg symptoms to how he slept in his car as it placed pressure on his buttock. Since arrival to the outside ED and transfer here, he notes that the leg symptoms have resolved and subjectively feels full strength now.   ROS: A 14 point ROS was performed and is negative except as noted in the HPI.   PMHx: History reviewed. No pertinent past medical history. FamHx:  Family History  Problem Relation Age of Onset   Hypertension Mother    Hypertension Father    SocHx:  reports that he has been smoking. He has been smoking an average of .5 packs per day. He has never used smokeless tobacco. He reports that he does not currently use alcohol. He reports that he does not use drugs.  Exam: Vital signs in last 24 hours: Temp:  [97.9 F (36.6 C)-98.8 F (37.1 C)] 97.9 F (36.6 C) (07/02 0245) Pulse Rate:  [53-80] 57 (07/02 0245) Resp:  [17-20] 17 (07/02 0245) BP: (100-124)/(60-85) 111/72 (07/02 0245) SpO2:  [90 %-100 %] 95 % (07/02 0245) Weight:  [77.1 kg] 77.1 kg (07/01 1341) General: Awake, alert, cooperative, lying in bed in NAD Head: Normocephalic and atruamatic HEENT: Neck supple Pulmonary: breathing room air comfortably, no evidence of increased work of breathing Cardiac: RRR Abdomen: S NT ND Extremities: Warm and well perfused x4, tender to palpation in the R gluteus Neuro: AOx3, PERRL, EOMI, FS Strength 5/5 x4, SILTx4, no drift, no hoffman's, no  clonus   Assessment and Plan: 32 y.o. man s/p head trauma with headaches, leg weakness after falling on a rock while rafting. MRI brain personally reviewed, which shows a left middle fossa arachnoid cyst with CSF signal diffusely along the left hemisphere consistent with a ruptured arachnoid cyst. . MRI lumbar spine shows chronic T12 fracture with no clinically significant foraminal or canal stenosis.  -Discussed with the patient, his neurologic exam is normal and his headaches are tolerable, would not recommend treatment of the suspected ruptured arachnoid cyst, discussed precautions and return criteria, okay for discharge home  Jadene Pierini, MD 02/12/23 7:18 AM [facility]

## 2023-02-12 NOTE — TOC Transition Note (Signed)
Transition of Care [facility]) - CM/SW Discharge Note   Patient Details  Name: Benjamin Reyes MRN: [id number] Date of Birth: 1991/02/16  [facility] CM/SW Contact:  Kermit Balo, RN Phone Number: 02/12/2023, 8:51 AM   Clinical Narrative:    Pt is discharging home with self care. Pt to schedule a f/u with Dr Maurice Small in 2 weeks.  Pt has transport home.   Final next level of care: Home/Self Care Barriers to Discharge: No Barriers Identified   Patient Goals and CMS Choice      Discharge Placement                         Discharge Plan and Services Additional resources added to the After Visit Summary for                                       Social Determinants of Health (SDOH) Interventions SDOH Screenings   Tobacco Use: High Risk (02/11/2023)     Readmission Risk Interventions     No data to display

## 2024-03-08 ENCOUNTER — Encounter (HOSPITAL_COMMUNITY): Payer: Self-pay | Admitting: Emergency Medicine

## 2024-03-08 ENCOUNTER — Observation Stay (HOSPITAL_COMMUNITY): Admitting: Anesthesiology

## 2024-03-08 ENCOUNTER — Emergency Department (HOSPITAL_COMMUNITY)

## 2024-03-08 ENCOUNTER — Observation Stay (HOSPITAL_COMMUNITY): Admission: EM | Admit: 2024-03-08 | Discharge: 2024-03-10 | Disposition: A | Discharge status: Home / Self Care

## 2024-03-08 ENCOUNTER — Encounter (HOSPITAL_COMMUNITY)
Admission: EM | Disposition: A | Discharge status: Home / Self Care | Payer: Self-pay | Source: Home / Self Care | Attending: General Surgery

## 2024-03-08 ENCOUNTER — Other Ambulatory Visit: Payer: Self-pay

## 2024-03-08 DIAGNOSIS — F17210 Nicotine dependence, cigarettes, uncomplicated: Secondary | ICD-10-CM | POA: Diagnosis present

## 2024-03-08 DIAGNOSIS — K460 Unspecified abdominal hernia with obstruction, without gangrene: Secondary | ICD-10-CM | POA: Diagnosis present

## 2024-03-08 DIAGNOSIS — K4030 Unilateral inguinal hernia, with obstruction, without gangrene, not specified as recurrent: Secondary | ICD-10-CM | POA: Diagnosis not present

## 2024-03-08 DIAGNOSIS — E876 Hypokalemia: Secondary | ICD-10-CM | POA: Diagnosis present

## 2024-03-08 DIAGNOSIS — Z79899 Other long term (current) drug therapy: Secondary | ICD-10-CM

## 2024-03-08 DIAGNOSIS — Z91048 Other nonmedicinal substance allergy status: Secondary | ICD-10-CM

## 2024-03-08 DIAGNOSIS — E872 Acidosis, unspecified: Secondary | ICD-10-CM | POA: Diagnosis present

## 2024-03-08 DIAGNOSIS — K4031 Unilateral inguinal hernia, with obstruction, without gangrene, recurrent: Principal | ICD-10-CM | POA: Diagnosis present

## 2024-03-08 DIAGNOSIS — Z8249 Family history of ischemic heart disease and other diseases of the circulatory system: Secondary | ICD-10-CM

## 2024-03-08 DIAGNOSIS — Z91030 Bee allergy status: Secondary | ICD-10-CM

## 2024-03-08 HISTORY — PX: OMENTECTOMY: SHX5985

## 2024-03-08 HISTORY — PX: INGUINAL HERNIA REPAIR: SHX194

## 2024-03-08 LAB — CBC WITH DIFFERENTIAL/PLATELET
Abs Immature Granulocytes: 0.02 K/uL (ref 0.00–0.07)
Basophils Absolute: 0 K/uL (ref 0.0–0.1)
Basophils Relative: 0 %
Eosinophils Absolute: 0.1 K/uL (ref 0.0–0.5)
Eosinophils Relative: 1 %
HCT: 50.9 % (ref 39.0–52.0)
Hemoglobin: 18.4 g/dL — ABNORMAL HIGH (ref 13.0–17.0)
Immature Granulocytes: 0 %
Lymphocytes Relative: 18 %
Lymphs Abs: 1.6 K/uL (ref 0.7–4.0)
MCH: 34.6 pg — ABNORMAL HIGH (ref 26.0–34.0)
MCHC: 36.1 g/dL — ABNORMAL HIGH (ref 30.0–36.0)
MCV: 95.7 fL (ref 80.0–100.0)
Monocytes Absolute: 0.5 K/uL (ref 0.1–1.0)
Monocytes Relative: 6 %
Neutro Abs: 6.6 K/uL (ref 1.7–7.7)
Neutrophils Relative %: 75 %
Platelets: 211 K/uL (ref 150–400)
RBC: 5.32 MIL/uL (ref 4.22–5.81)
RDW: 12.4 % (ref 11.5–15.5)
WBC: 8.9 K/uL (ref 4.0–10.5)
nRBC: 0 % (ref 0.0–0.2)

## 2024-03-08 LAB — BASIC METABOLIC PANEL WITH GFR
Anion gap: 13 (ref 5–15)
BUN: 14 mg/dL (ref 6–20)
CO2: 21 mmol/L — ABNORMAL LOW (ref 22–32)
Calcium: 9.5 mg/dL (ref 8.9–10.3)
Chloride: 104 mmol/L (ref 98–111)
Creatinine, Ser: 0.75 mg/dL (ref 0.61–1.24)
GFR, Estimated: >60 mL/min (ref >60)
Glucose, Bld: 122 mg/dL — ABNORMAL HIGH (ref 70–99)
Potassium: 3.2 mmol/L — ABNORMAL LOW (ref 3.5–5.1)
Sodium: 138 mmol/L (ref 135–145)

## 2024-03-08 LAB — LACTIC ACID, PLASMA
Lactic Acid, Venous: 2.1 mmol/L (ref 0.5–1.9)
Lactic Acid, Venous: 1.9 mmol/L (ref 0.5–1.9)

## 2024-03-08 SURGERY — REPAIR, HERNIA, INGUINAL, INCARCERATED
Anesthesia: General | Laterality: Right

## 2024-03-08 MED ORDER — FENTANYL CITRATE (PF) 100 MCG/2ML IJ SOLN
INTRAMUSCULAR | Status: DC | PRN
  Administered 2024-03-08 (×2): 100 ug via INTRAVENOUS

## 2024-03-08 MED ORDER — SUCCINYLCHOLINE CHLORIDE 200 MG/10ML IV SOSY
PREFILLED_SYRINGE | INTRAVENOUS | Status: AC
Start: 2024-03-08 — End: 2024-03-08
  Filled 2024-03-08: qty 10

## 2024-03-08 MED ORDER — FENTANYL CITRATE (PF) 100 MCG/2ML IJ SOLN
INTRAMUSCULAR | Status: AC
  Filled 2024-03-08: qty 2

## 2024-03-08 MED ORDER — MIDAZOLAM HCL 5 MG/5ML IJ SOLN
INTRAMUSCULAR | Status: DC | PRN
  Administered 2024-03-08: 2 mg via INTRAVENOUS

## 2024-03-08 MED ORDER — ROCURONIUM BROMIDE 10 MG/ML (PF) SYRINGE
PREFILLED_SYRINGE | INTRAVENOUS | Status: AC
  Filled 2024-03-08: qty 10

## 2024-03-08 MED ORDER — SODIUM CHLORIDE 0.9 % IV SOLN: INTRAVENOUS | Status: DC | PRN

## 2024-03-08 MED ORDER — SODIUM CHLORIDE 0.9 % IV SOLN
INTRAVENOUS | Status: AC
  Filled 2024-03-08: qty 2

## 2024-03-08 MED ORDER — ROCURONIUM BROMIDE 100 MG/10ML IV SOLN
INTRAVENOUS | Status: DC | PRN
  Administered 2024-03-08: 10 mg via INTRAVENOUS
  Administered 2024-03-08: 50 mg via INTRAVENOUS

## 2024-03-08 MED ORDER — SODIUM CHLORIDE 0.9 % IV BOLUS
1000.0000 mL | Freq: Once | INTRAVENOUS | Status: AC
  Administered 2024-03-08: 1000 mL via INTRAVENOUS

## 2024-03-08 MED ORDER — PROPOFOL 10 MG/ML IV BOLUS
INTRAVENOUS | Status: DC | PRN
  Administered 2024-03-08: 150 mg via INTRAVENOUS

## 2024-03-08 MED ORDER — CHLORHEXIDINE GLUCONATE CLOTH 2 % EX PADS: 6.0000 | MEDICATED_PAD | Freq: Once | CUTANEOUS | Status: DC

## 2024-03-08 MED ORDER — SODIUM CHLORIDE 0.9 % IR SOLN
Status: DC | PRN
  Administered 2024-03-08: 1000 mL

## 2024-03-08 MED ORDER — HYDROMORPHONE HCL 1 MG/ML IJ SOLN
0.5000 mg | Freq: Once | INTRAMUSCULAR | Status: AC
  Administered 2024-03-08: 0.5 mg via INTRAVENOUS
  Filled 2024-03-08: qty 0.5

## 2024-03-08 MED ORDER — SUGAMMADEX SODIUM 200 MG/2ML IV SOLN
INTRAVENOUS | Status: AC
  Filled 2024-03-08: qty 2

## 2024-03-08 MED ORDER — BUPIVACAINE HCL (PF) 0.5 % IJ SOLN
INTRAMUSCULAR | Status: AC
  Filled 2024-03-08: qty 30

## 2024-03-08 MED ORDER — SUCCINYLCHOLINE CHLORIDE 200 MG/10ML IV SOSY
PREFILLED_SYRINGE | INTRAVENOUS | Status: DC | PRN
  Administered 2024-03-08: 100 mg via INTRAVENOUS

## 2024-03-08 MED ORDER — PROPOFOL 10 MG/ML IV BOLUS
INTRAVENOUS | Status: AC
  Filled 2024-03-08: qty 20

## 2024-03-08 MED ORDER — BUPIVACAINE HCL (PF) 0.5 % IJ SOLN
INTRAMUSCULAR | Status: DC | PRN
  Administered 2024-03-08: 30 mL

## 2024-03-08 MED ORDER — MIDAZOLAM HCL 2 MG/2ML IJ SOLN
INTRAMUSCULAR | Status: AC
  Filled 2024-03-08: qty 2

## 2024-03-08 MED ORDER — SUGAMMADEX SODIUM 200 MG/2ML IV SOLN
INTRAVENOUS | Status: DC | PRN
  Administered 2024-03-08: 200 mg via INTRAVENOUS

## 2024-03-08 MED ORDER — FENTANYL CITRATE (PF) 100 MCG/2ML IJ SOLN
INTRAMUSCULAR | Status: AC
Start: 2024-03-08 — End: 2024-03-08
  Filled 2024-03-08: qty 2

## 2024-03-08 MED ORDER — IOHEXOL 300 MG/ML  SOLN
100.0000 mL | Freq: Once | INTRAMUSCULAR | Status: AC | PRN
  Administered 2024-03-08: 100 mL via INTRAVENOUS

## 2024-03-08 MED ORDER — ONDANSETRON HCL 4 MG/2ML IJ SOLN
4.0000 mg | Freq: Four times a day (QID) | INTRAMUSCULAR | Status: DC | PRN
  Administered 2024-03-08: 4 mg via INTRAVENOUS
  Filled 2024-03-08: qty 2

## 2024-03-08 MED ORDER — SODIUM CHLORIDE 0.9 % IV SOLN
2.0000 g | INTRAVENOUS | Status: AC
  Administered 2024-03-08: 2 g via INTRAVENOUS
  Filled 2024-03-08: qty 2

## 2024-03-08 SURGICAL SUPPLY — 38 items
CLOTH BEACON ORANGE TIMEOUT ST (SAFETY) ×2 IMPLANT
COVER LIGHT HANDLE STERIS (MISCELLANEOUS) ×4 IMPLANT
DERMABOND ADVANCED .7 DNX12 (GAUZE/BANDAGES/DRESSINGS) ×2 IMPLANT
DERMABOND ADVANCED .7 DNX6 (GAUZE/BANDAGES/DRESSINGS) IMPLANT
DRAIN PENROSE 0.5X18 (DRAIN) ×2 IMPLANT
DRAIN PENROSE 0.75X12 (DRAIN) IMPLANT
ELECTRODE REM PT RTRN 9FT ADLT (ELECTROSURGICAL) ×2 IMPLANT
GAUZE SPONGE 4X4 12PLY STRL (GAUZE/BANDAGES/DRESSINGS) ×2 IMPLANT
GLOVE BIO SURGEON STRL SZ 6.5 (GLOVE) ×2 IMPLANT
GLOVE BIOGEL PI IND STRL 6.5 (GLOVE) ×2 IMPLANT
GLOVE BIOGEL PI IND STRL 7.0 (GLOVE) ×4 IMPLANT
GOWN STRL REUS W/TWL LRG LVL3 (GOWN DISPOSABLE) ×6 IMPLANT
KIT TURNOVER KIT A (KITS) ×2 IMPLANT
LIGASURE IMPACT 36 18CM CVD LR (INSTRUMENTS) IMPLANT
MANIFOLD NEPTUNE II (INSTRUMENTS) ×2 IMPLANT
MESH HERNIA 1.6X1.9 PLUG LRG (Mesh General) IMPLANT
NDL HYPO 18GX1.5 BLUNT FILL (NEEDLE) ×2 IMPLANT
NDL HYPO 21X1.5 SAFETY (NEEDLE) IMPLANT
NEEDLE HYPO 18GX1.5 BLUNT FILL (NEEDLE) ×2 IMPLANT
NEEDLE HYPO 21X1.5 SAFETY (NEEDLE) ×2 IMPLANT
NS IRRIG 1000ML POUR BTL (IV SOLUTION) ×2 IMPLANT
PACK MINOR (CUSTOM PROCEDURE TRAY) ×2 IMPLANT
PAD ARMBOARD POSITIONER FOAM (MISCELLANEOUS) ×2 IMPLANT
POSITIONER HEAD 8X9X4 ADT (SOFTGOODS) ×2 IMPLANT
SET BASIN LINEN APH (SET/KITS/TRAYS/PACK) ×2 IMPLANT
SOL PREP PROV IODINE SCRUB 4OZ (MISCELLANEOUS) ×2 IMPLANT
SPONGE DRAIN TRACH 4X4 STRL 2S (GAUZE/BANDAGES/DRESSINGS) IMPLANT
SPONGE T-LAP 18X18 ~~LOC~~+RFID (SPONGE) IMPLANT
STAPLER VISISTAT (STAPLE) IMPLANT
SUT MNCRL AB 4-0 PS2 18 (SUTURE) ×2 IMPLANT
SUT NOVA NAB GS-22 2 2-0 T-19 (SUTURE) IMPLANT
SUT NOVAFIL NAB HGS22 2-0 30IN (SUTURE) IMPLANT
SUT PDS AB 0 CTX 60 (SUTURE) IMPLANT
SUT SILK 3 0 SH CR/8 (SUTURE) IMPLANT
SUT VIC AB 2-0 CT2 27 (SUTURE) IMPLANT
SUT VIC AB 3-0 SH 27X BRD (SUTURE) ×2 IMPLANT
SYR 30ML LL (SYRINGE) ×2 IMPLANT
TAPE SURG TRANSPORE 1 IN (GAUZE/BANDAGES/DRESSINGS) IMPLANT

## 2024-03-08 NOTE — Anesthesia Procedure Notes (Signed)
 Procedure Name: Intubation Date/Time: 03/08/2024 9:49 PM  Performed by: Kendell Yvonna PARAS, MDPre-anesthesia Checklist: Patient identified, Emergency Drugs available, Suction available, Patient being monitored and Timeout performed Patient Re-evaluated:Patient Re-evaluated prior to induction Oxygen Delivery Method: Circle system utilized Preoxygenation: Pre-oxygenation with 100% oxygen Induction Type: IV induction Laryngoscope Size: Glidescope and 3 Grade View: Grade I Tube type: Oral Tube size: 7.5 mm Number of attempts: 1 Airway Equipment and Method: Video-laryngoscopy and Stylet Placement Confirmation: ETT inserted through vocal cords under direct vision, positive ETCO2 and breath sounds checked- equal and bilateral Secured at: 22 cm Tube secured with: Tape Dental Injury: Teeth and Oropharynx as per pre-operative assessment

## 2024-03-08 NOTE — Progress Notes (Addendum)
 [facility]  Updated his wife. Open right inguinal hernia repair with mesh, reduced the colon. Foley to remain in overnight, JP drain into the right scrotum due to risk of seroma/ hematoma.  Clear diet tonight PRN for pain Labs tomorrow Foley overnight  Manuelita Pander, MD [facility] [address] [place], [place] [address] [phone] (office)

## 2024-03-08 NOTE — Transfer of Care (Signed)
 Immediate Anesthesia Transfer of Care Note  Patient: Benjamin Reyes  Procedure(s) Performed: REPAIR, HERNIA, INGUINAL, INCARCERATED (Right) OMENTECTOMY  Patient Location: PACU  Anesthesia Type:General  Level of Consciousness: awake and alert   Airway & Oxygen Therapy: Patient Spontanous Breathing  Post-op Assessment: Report given to RN and Post -op Vital signs reviewed and stable  Post vital signs: Reviewed and stable  Last Vitals:  Vitals Value Taken Time  BP 166/93 03/08/24 23:33  Temp    Pulse 90 03/08/24 23:35  Resp 17 03/08/24 23:35  SpO2 97 % 03/08/24 23:35  Vitals shown include unfiled device data.  Last Pain:  Vitals:   03/08/24 2110  TempSrc:   PainSc: 10-Worst pain ever      Patients Stated Pain Goal: 3 (03/08/24 1822)  Complications: No notable events documented.

## 2024-03-08 NOTE — Anesthesia Preprocedure Evaluation (Signed)
 Anesthesia Evaluation  Patient identified by MRN, date of birth, ID band Patient awake    Reviewed: Allergy & Precautions, H&P , NPO status , Patient's Chart, lab work & pertinent test results, reviewed documented beta blocker date and time   Airway Mallampati: II  TM Distance: >3 FB Neck ROM: full    Dental no notable dental hx.    Pulmonary neg pulmonary ROS, Current Smoker   Pulmonary exam normal breath sounds clear to auscultation       Cardiovascular Exercise Tolerance: Good hypertension, negative cardio ROS  Rhythm:regular Rate:Normal     Neuro/Psych negative neurological ROS  negative psych ROS   GI/Hepatic negative GI ROS, Neg liver ROS,,,  Endo/Other  negative endocrine ROS    Renal/GU negative Renal ROS  negative genitourinary   Musculoskeletal   Abdominal   Peds  Hematology negative hematology ROS (+)   Anesthesia Other Findings   Reproductive/Obstetrics negative OB ROS                              Anesthesia Physical Anesthesia Plan  ASA: 4 and emergent  Anesthesia Plan: General and General ETT   Post-op Pain Management:    Induction:   PONV Risk Score and Plan: Ondansetron   Airway Management Planned:   Additional Equipment:   Intra-op Plan:   Post-operative Plan:   Informed Consent: I have reviewed the patients History and Physical, chart, labs and discussed the procedure including the risks, benefits and alternatives for the proposed anesthesia with the patient or authorized representative who has indicated his/her understanding and acceptance.     Dental Advisory Given  Plan Discussed with: CRNA  Anesthesia Plan Comments:         Anesthesia Quick Evaluation

## 2024-03-08 NOTE — ED Notes (Signed)
 Pt transferred to  OR.
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
## 2024-03-08 NOTE — ED Provider Notes (Signed)
 [facility]PART[facility] AT [facility] Provider Note   CSN: [id number] Arrival date & time: 03/08/24  8196     Patient presents with: Inguinal Hernia   Benjamin Reyes is a 33 y.o. male.  {Add pertinent medical, surgical, social history, OB history to HPI:3533} 33 year old male presents for evaluation of right inguinal hernia.  States has had a history of a hernia there but has always been able to reduce it.  Was moving something out in the yard prior to arrival and felt a large pop.  He has been unable to bruise due to the mass and states is a lot larger than usual.  Admits some nausea and vomiting.  Denies any other symptoms or concerns.        Prior to Admission medications   Medication Sig Start Date End Date Taking? Authorizing Provider  docusate sodium  (COLACE) 100 MG capsule Take 1 capsule (100 mg total) by mouth 2 (two) times daily. 12/04/21   Bergman, Meghan D, NP  HYDROcodone -acetaminophen  (NORCO/VICODIN) 5-325 MG tablet Take 1 tablet by mouth every 4 (four) hours as needed for moderate pain. 12/04/21   Bergman, Meghan D, NP  hydrocortisone  cream 1 % Apply to affected area 2 times daily 12/31/18   Loetta Senior, MD  ibuprofen  (ADVIL ,MOTRIN ) 600 MG tablet Take 1 tablet (600 mg total) by mouth every 6 (six) hours as needed. 08/23/14   Randol Simmonds, MD  methocarbamol  (ROBAXIN ) 500 MG tablet Take 1 tablet (500 mg total) by mouth every 6 (six) hours as needed for muscle spasms. 12/04/21   Bergman, Meghan D, NP    Allergies: Bee venom and Poison ivy extract    Review of Systems  Constitutional:  Negative for chills and fever.  HENT:  Negative for ear pain and sore throat.   Eyes:  Negative for pain and visual disturbance.  Respiratory:  Negative for cough and shortness of breath.   Cardiovascular:  Negative for chest pain and palpitations.  Gastrointestinal:  Positive for abdominal pain. Negative for vomiting.  Genitourinary:  Positive for scrotal swelling.  Negative for dysuria and hematuria.  Musculoskeletal:  Negative for arthralgias and back pain.  Skin:  Negative for color change and rash.  Neurological:  Negative for seizures and syncope.  All other systems reviewed and are negative.   Updated Vital Signs BP 136/79   Pulse 70   Temp 98.3 F (36.8 C) (Oral)   Resp 18   Ht 6' 3 (1.905 m)   Wt 77.1 kg   SpO2 96%   BMI 21.25 kg/m   Physical Exam Vitals and nursing note reviewed.  Constitutional:      General: He is in acute distress.     Appearance: Normal appearance. He is well-developed. He is not ill-appearing.     Comments: Appears uncomfortable  HENT:     Head: Normocephalic and atraumatic.  Eyes:     Conjunctiva/sclera: Conjunctivae normal.  Cardiovascular:     Rate and Rhythm: Normal rate and regular rhythm.     Heart sounds: No murmur heard. Pulmonary:     Effort: Pulmonary effort is normal. No respiratory distress.     Breath sounds: Normal breath sounds.  Abdominal:     Palpations: Abdomen is soft.     Tenderness: There is no abdominal tenderness.     Comments: There is a very large right inguinal hernia that is not red but is exquisitely tender and irreducible despite the use of Trendelenburg, pain meds and ice  Musculoskeletal:  General: No swelling.     Cervical back: Neck supple.  Skin:    General: Skin is warm and dry.     Capillary Refill: Capillary refill takes less than 2 seconds.  Neurological:     Mental Status: He is alert.  Psychiatric:        Mood and Affect: Mood normal.     (all labs ordered are listed, but only abnormal results are displayed) Labs Reviewed  BASIC METABOLIC PANEL WITH GFR - Abnormal; Notable for the following components:      Result Value   Potassium 3.2 (*)    CO2 21 (*)    Glucose, Bld 122 (*)    All other components within normal limits  LACTIC ACID, PLASMA - Abnormal; Notable for the following components:   Lactic Acid, Venous 2.1 (*)    All other  components within normal limits  CBC WITH DIFFERENTIAL/PLATELET - Abnormal; Notable for the following components:   Hemoglobin 18.4 (*)    MCH 34.6 (*)    MCHC 36.1 (*)    All other components within normal limits  LACTIC ACID, PLASMA    EKG: None  Radiology: CT ABDOMEN PELVIS W CONTRAST Addendum Date: 03/08/2024 ADDENDUM REPORT: 03/08/2024 19:56 ADDENDUM: These results were called by telephone at the time of interpretation on 03/08/2024 at 7:56 pm to provider [facility] , who verbally acknowledged these results. Electronically Signed   By: Franky Crease M.D.   On: 03/08/2024 19:56   Result Date: 03/08/2024 CLINICAL DATA:  Enlarging right inguinal hernia EXAM: CT ABDOMEN AND PELVIS WITH CONTRAST TECHNIQUE: Multidetector CT imaging of the abdomen and pelvis was performed using the standard protocol following bolus administration of intravenous contrast. RADIATION DOSE REDUCTION: This exam was performed according to the departmental dose-optimization program which includes automated exposure control, adjustment of the mA and/or kV according to patient size and/or use of iterative reconstruction technique. CONTRAST:  OMNIPAQUE  IOHEXOL  300 MG/ML  SOLN COMPARISON:  12/02/2021 FINDINGS: Lower chest: No acute abnormality. Hepatobiliary: No focal hepatic abnormality. Gallbladder unremarkable. Pancreas: No focal abnormality or ductal dilatation. Spleen: No focal abnormality.  Normal size. Adrenals/Urinary Tract: No adrenal abnormality. No focal renal abnormality. No stones or hydronephrosis. Urinary bladder is unremarkable. Stomach/Bowel: There is a large right inguinal hernia containing transverse colon. The right colon is mildly dilated with air-fluid levels. Stomach and small bowel are decompressed. Mild bowel wall thickening within the herniated transverse colon. Vascular/Lymphatic: 6 No evidence of aneurysm or adenopathy. Reproductive: No visible focal abnormality. Other: No free fluid or free  air. Musculoskeletal: No acute bony abnormality. IMPRESSION: Large right inguinal hernia now containing the transverse colon. The wall of the herniated transverse colon appears mildly thickened suggesting the possibility of vascular compromise and ischemia or venous congestion. Mildly dilated cecum and ascending colon with air-fluid levels suggest a component of obstruction. Small bowel is decompressed. Electronically Signed: By: Franky Crease M.D. On: 03/08/2024 19:51    {Document cardiac monitor, telemetry assessment procedure when appropriate:32947} Procedures   Medications Ordered in the ED  Chlorhexidine  Gluconate Cloth 2 % PADS 6 each (has no administration in time range)  cefoTEtan  (CEFOTAN ) 2 g in sodium chloride  0.9 % 100 mL IVPB (has no administration in time range)  HYDROmorphone  (DILAUDID ) injection 0.5 mg (0.5 mg Intravenous Given 03/08/24 1904)  iohexol  (OMNIPAQUE ) 300 MG/ML solution 100 mL (100 mLs Intravenous Contrast Given 03/08/24 1932)  sodium chloride  0.9 % bolus 1,000 mL (1,000 mLs Intravenous New Bag/Given 03/08/24 2011)      {  Click here for ABCD2, HEART and other calculators REFRESH Note before signing:1}                              Medical Decision Making Amount and/or Complexity of Data Reviewed Labs: ordered. Radiology: ordered.  Risk Prescription drug management. Decision regarding hospitalization.   ***  {Document critical care time when appropriate  Document review of labs and clinical decision tools ie CHADS2VASC2, etc  Document your independent review of radiology images and any outside records  Document your discussion with family members, caretakers and with consultants  Document social determinants of health affecting pt's care  Document your decision making why or why not admission, treatments were needed:32947:::1}   Final diagnoses:  Strangulated inguinal hernia    ED Discharge Orders     None

## 2024-03-08 NOTE — Op Note (Signed)
 [facility] Operative Note  03/08/24  Preoperative Diagnosis: Incarcerated recurrent right inguinal hernia    Postoperative Diagnosis: Same   Procedure(s) Performed: Right inguinal hernia repair with mesh, partial omentectomy    Surgeon: Benjamin BROCKS. Kallie, MD   Assistants: No qualified resident was available   Anesthesia: General endotracheal   Anesthesiologist: Benjamin Yvonna PARAS, MD    Specimens: Omentum, hernia sac    Estimated Blood Loss: 25cc   Blood Replacement: None    Complications: None   Wound Class: Clean   Operative Indications: Benjamin Reyes is a 33 yo who comes in with worsening inguinal hernia with pain and inability to reduce it after working on a fence. He has had 2 prior open hernia repairs with at least 1 mesh and a prior laparoscopic hernia repair with mesh.   Findings: Large recurrent hernia with colon, viable healthy colon    Procedure: The patient was taken to the operating room and placed supine. General endotracheal anesthesia was induced. Intravenous antibiotics were  administered per protocol.  A time out was preformed verifying the correct patient, procedure, site, positioning and implants. A foley was placed. The abdomen and bilateral groin/ penis and scrotum were prepared and draped in the usual sterile fashion.   A prior right inguinal incision was noted and I made my incision through this scar.  The incision was deepened with electrocautery through the remnants of Scarpa's and Camper's fascia until the aponeurosis of what appeared to be the the external oblique was encountered. This external oblique did have mesh fused to it almost as an onlay. I had split this mesh to further define the structures.  Once this was split, I was able to define the flaps of the external oblique were developed cephalad and inferiorly, noting the shelfing edge inferiorly and what appeared to me more mesh incorporated into the area. I did not see the  ilioinguinal nerve.   The large hernia was protruding into the scrotum and I could not visualized the cord initially. I was able to get the hernia out of the scrotum and the I opened the sac with care to be able to tell if the colon was viable. The colon was viable and healthy and a significant amount of omentum was in the hernia sac. I had to dissect off the omentum with the Ligasure to get the colon freed up and back into the internal ring.  Once the colon was in the hernia ring, I noted a large redundant sac, and I stripped the cord structures from the hernia sac with care while placing traction on the testicle.   The sac was carefully dissected free from the cord down to the level of the internal ring.  The vas and testicular vessels were identified and protected from harm.  Once the sac was dissected free from the cords, the Penrose was placed around the cord which was retracted inferiorly out of the field of view.  The hernia sac was then suture ligated twice with 2-0 Vicryl suture and the excess was excised.  The remaining sac was reduced into the internal ring without difficulty.  Two large Perfix Plug was placed into the defect and filled the space. These were secured with 2-0 Novafil to the internal ring and to each other.  Attention was then turned to the floor of the canal, which was grossly weakened without any defined defect or sac. There was incorporation of prior mesh in the tissue. The Perfix Mesh Patch was sutured to  the inguinal ligament inferiorly starting at the pubic tubercle using 2-0 Novafil interrupted sutures.  The mesh was sutured superiorly to the conjoint tendon/ prior mesh using 2-0 Novafil interrupted sutures.  Care was taken to ensure the mesh was placed in a relaxed fashion to avoid excessive tension and no obvious neurovascular structures were caught in the repair.  Laterally the tails of the mesh were crossed and the internal ring was recreated, allowing for passage of cords  without tension.   Hemostasis was adequate.  The Penrose was removed.  The external oblique aponeurosis was closed with a 2-0 Vicryl suture in a running fashion. I placed a JP drain into the scrotum to help with seroma/ hematoma formation, and brought this out inferior to the incision through a stab incision. This was secured with a 3-0 Nylon suture.  Scarpa's fashion was closed with 3-0 Vicryl interrupted sutures. The skin was closed with a subcuticular 4-0 Monocryl suture.  Dermabond was applied.   The testis was gently pulled down into its anatomic position in the scrotum.  The patient tolerated the procedure well and was taken to the PACU in stable condition. All counts were correct at the end of the case. The patient's foley remained in place given the dissection and concern for potential retention overnight.       Benjamin Pander, MD [facility] [address] [place], [place] [address] [phone] (office)

## 2024-03-08 NOTE — H&P (Signed)
 [facility] History and Physical  Reason for Referral: Incarcerated right inguinal hernia, concern for ischemic colon  Referring Physician: Dr. Gennaro Chief Complaint   Inguinal Hernia     Benjamin Reyes is a 33 y.o. male.  HPI:    Benjamin Reyes is a 33 yo who comes in with pain in the right groin and worsening right inguinal hernia. He says that he was working on a fence and  the hernia got stuck out. It is larger than it ever has been and he is in severe pain. He has been having some nausea/vomiting. He is here with his wife. He reports prior bilateral inguinal hernia repair. He had a left open hernia repair, 2 right open hernia repairs with mesh at the [facility] and a laparoscopic repair over 2 years ago at [facility]. This is a total of 3 repairs on the right side. He says he has always started to lift prior to healing and blames that on his recurrences.   History reviewed. No pertinent past medical history.  Past Surgical History:  Procedure Laterality Date   HERNIA REPAIR      Family History  Problem Relation Age of Onset   Hypertension Mother    Hypertension Father     Social History   Tobacco Use   Smoking status: Every Day    Current packs/day: 0.50    Types: Cigarettes   Smokeless tobacco: Never  Substance Use Topics   Alcohol use: Not Currently   Drug use: No    Medications: I have reviewed the patient's current medications. Current Facility-Administered Medications  Medication Dose Route Frequency Provider Last Rate Last Admin   [START ON 03/09/2024] cefoTEtan  (CEFOTAN ) 2 g in sodium chloride  0.9 % 100 mL IVPB  2 g Intravenous On Call to OR Kallie Benjamin BROCKS, MD       Chlorhexidine  Gluconate Cloth 2 % PADS 6 each  6 each Topical Once Kallie Benjamin BROCKS, MD       ondansetron  (ZOFRAN ) injection 4 mg  4 mg Intravenous Q6H PRN Kallie Benjamin BROCKS, MD   4 mg at 03/08/24 2105   Current Outpatient Medications  Medication Sig Dispense Refill Last  Dose/Taking   docusate sodium  (COLACE) 100 MG capsule Take 1 capsule (100 mg total) by mouth 2 (two) times daily. 10 capsule 0    HYDROcodone -acetaminophen  (NORCO/VICODIN) 5-325 MG tablet Take 1 tablet by mouth every 4 (four) hours as needed for moderate pain. 30 tablet 0    hydrocortisone  cream 1 % Apply to affected area 2 times daily 15 g 0    ibuprofen  (ADVIL ,MOTRIN ) 600 MG tablet Take 1 tablet (600 mg total) by mouth every 6 (six) hours as needed. 30 tablet 0    methocarbamol  (ROBAXIN ) 500 MG tablet Take 1 tablet (500 mg total) by mouth every 6 (six) hours as needed for muscle spasms. 20 tablet 1     Allergies  Allergen Reactions   Bee Venom Other (See Comments)    Unknown    Poison Ivy Extract Rash     ROS:  A comprehensive review of systems was negative except for: Gastrointestinal: positive for abdominal pain, nausea, and vomiting  Blood pressure 136/79, pulse 70, temperature 98.3 F (36.8 C), temperature source Oral, resp. rate 18, height 6' 3 (1.905 m), weight 77.1 kg, SpO2 96%. Physical Exam Vitals reviewed.  HENT:     Head: Normocephalic.     Nose: Nose normal.  Eyes:     Pupils: Pupils are equal,  round, and reactive to light.  Cardiovascular:     Rate and Rhythm: Normal rate.  Pulmonary:     Effort: Pulmonary effort is normal.  Abdominal:     General: There is distension.     Palpations: Abdomen is soft.     Tenderness: There is abdominal tenderness. There is no guarding or rebound.     Hernia: A hernia is present. Hernia is present in the right inguinal area.     Comments: Incarcerated right inguinal hernia, prior incisions on right and left inguinal region  Musculoskeletal:        General: No swelling.     Cervical back: Normal range of motion.  Skin:    General: Skin is warm.  Neurological:     General: No focal deficit present.     Mental Status: He is alert and oriented to person, place, and time.  Psychiatric:        Mood and Affect: Mood normal.         Behavior: Behavior normal.      Results: Results for orders placed or performed during the hospital encounter of 03/08/24 (from the past 48 hours)  Basic metabolic panel     Status: Abnormal   Collection Time: 03/08/24  6:55 PM  Result Value Ref Range   Sodium 138 135 - 145 mmol/L   Potassium 3.2 (L) 3.5 - 5.1 mmol/L   Chloride 104 98 - 111 mmol/L   CO2 21 (L) 22 - 32 mmol/L   Glucose, Bld 122 (H) 70 - 99 mg/dL    Comment: Glucose reference range applies only to samples taken after fasting for at least 8 hours.   BUN 14 6 - 20 mg/dL   Creatinine, Ser 9.24 0.61 - 1.24 mg/dL   Calcium 9.5 8.9 - 89.6 mg/dL   GFR, Estimated >39 >39 mL/min    Comment: (NOTE) Calculated using the CKD-EPI Creatinine Equation (2021)    Anion gap 13 5 - 15    Comment: Performed at [facility], [address] [place], [place] [address]  Lactic acid, plasma     Status: Abnormal   Collection Time: 03/08/24  6:55 PM  Result Value Ref Range   Lactic Acid, Venous 2.1 (HH) 0.5 - 1.9 mmol/L    Comment: CRITICAL RESULT CALLED TO, READ BACK BY AND VERIFIED WITH RUSH,RN ON 03/08/24 AT 1945 BY PURDIE,J Performed at [facility], [address], [place], [place] [address]   CBC with Differential     Status: Abnormal   Collection Time: 03/08/24  6:55 PM  Result Value Ref Range   WBC 8.9 4.0 - 10.5 K/uL   RBC 5.32 4.22 - 5.81 MIL/uL   Hemoglobin 18.4 (H) 13.0 - 17.0 g/dL   HCT 49.0 60.9 - 47.9 %   MCV 95.7 80.0 - 100.0 fL   MCH 34.6 (H) 26.0 - 34.0 pg   MCHC 36.1 (H) 30.0 - 36.0 g/dL   RDW 87.5 88.4 - 84.4 %   Platelets 211 150 - 400 K/uL   nRBC 0.0 0.0 - 0.2 %   Neutrophils Relative % 75 %   Neutro Abs 6.6 1.7 - 7.7 K/uL   Lymphocytes Relative 18 %   Lymphs Abs 1.6 0.7 - 4.0 K/uL   Monocytes Relative 6 %   Monocytes Absolute 0.5 0.1 - 1.0 K/uL   Eosinophils Relative 1 %   Eosinophils Absolute 0.1 0.0 - 0.5 K/uL   Basophils Relative 0 %   Basophils Absolute 0.0 0.0 -  0.1 K/uL   Immature Granulocytes 0  %   Abs Immature Granulocytes 0.02 0.00 - 0.07 K/uL    Comment: Performed at [facility], [address], [place], [place] [address]  Lactic acid, plasma     Status: None   Collection Time: 03/08/24  8:39 PM  Result Value Ref Range   Lactic Acid, Venous 1.9 0.5 - 1.9 mmol/L    Comment: Performed at [facility], [address], [place], [place] [address]   Personally reviewed and showed the patient and his wife. Showed large hernia with colon in the scrotum.  CT ABDOMEN PELVIS W CONTRAST Addendum Date: 03/08/2024 ADDENDUM REPORT: 03/08/2024 19:56 ADDENDUM: These results were called by telephone at the time of interpretation on 03/08/2024 at 7:56 pm to provider [facility] , who verbally acknowledged these results. Electronically Signed   By: Franky Crease M.D.   On: 03/08/2024 19:56   Result Date: 03/08/2024 CLINICAL DATA:  Enlarging right inguinal hernia EXAM: CT ABDOMEN AND PELVIS WITH CONTRAST TECHNIQUE: Multidetector CT imaging of the abdomen and pelvis was performed using the standard protocol following bolus administration of intravenous contrast. RADIATION DOSE REDUCTION: This exam was performed according to the departmental dose-optimization program which includes automated exposure control, adjustment of the mA and/or kV according to patient size and/or use of iterative reconstruction technique. CONTRAST:  OMNIPAQUE  IOHEXOL  300 MG/ML  SOLN COMPARISON:  12/02/2021 FINDINGS: Lower chest: No acute abnormality. Hepatobiliary: No focal hepatic abnormality. Gallbladder unremarkable. Pancreas: No focal abnormality or ductal dilatation. Spleen: No focal abnormality.  Normal size. Adrenals/Urinary Tract: No adrenal abnormality. No focal renal abnormality. No stones or hydronephrosis. Urinary bladder is unremarkable. Stomach/Bowel: There is a large right inguinal hernia containing transverse colon. The right colon is mildly dilated with air-fluid levels. Stomach and small bowel are decompressed.  Mild bowel wall thickening within the herniated transverse colon. Vascular/Lymphatic: 6 No evidence of aneurysm or adenopathy. Reproductive: No visible focal abnormality. Other: No free fluid or free air. Musculoskeletal: No acute bony abnormality. IMPRESSION: Large right inguinal hernia now containing the transverse colon. The wall of the herniated transverse colon appears mildly thickened suggesting the possibility of vascular compromise and ischemia or venous congestion. Mildly dilated cecum and ascending colon with air-fluid levels suggest a component of obstruction. Small bowel is decompressed. Electronically Signed: By: Franky Crease M.D. On: 03/08/2024 19:51     Assessment and Plan:  Benjamin Reyes is a 33 y.o. male with an incarcerated right inguinal hernia with colon and concern for impending ischemia. Discussed his prior repairs, risk of recurrence and plan for repair today. Discussed open inguinal hernia repair, possible mesh, possible primary repair, possible exploratory laparotomy, possible bowel resection. Discussed the risk of bleeding, infection, injury to nerves and  testicle, anastomotic leak. Discussed recurrence risk being high given all his recurrences and  this emergency repair.   Discussed the need for admission post operatively.   All questions were answered to the satisfaction of the patient and family.   Benjamin Reyes 03/08/2024, 9:06 PM

## 2024-03-08 NOTE — Anesthesia Postprocedure Evaluation (Signed)
 Anesthesia Post Note  Patient: Benjamin Reyes  Procedure(s) Performed: REPAIR, HERNIA, INGUINAL, INCARCERATED (Right) OMENTECTOMY  Patient location during evaluation: PACU Anesthesia Type: General Level of consciousness: awake and alert Pain management: pain level controlled Vital Signs Assessment: post-procedure vital signs reviewed and stable Respiratory status: spontaneous breathing, nonlabored ventilation, respiratory function stable and patient connected to nasal cannula oxygen Cardiovascular status: blood pressure returned to baseline and stable Postop Assessment: no apparent nausea or vomiting Anesthetic complications: no   No notable events documented.   Last Vitals:  Vitals:   03/08/24 1845 03/08/24 2110  BP: 136/79 (!) 153/92  Pulse: 70 75  Resp:    Temp:    SpO2: 96% 95%    Last Pain:  Vitals:   03/08/24 2110  TempSrc:   PainSc: 10-Worst pain ever                 Yvonna JINNY Bosworth

## 2024-03-09 DIAGNOSIS — Z79899 Other long term (current) drug therapy: Secondary | ICD-10-CM | POA: Diagnosis not present

## 2024-03-09 DIAGNOSIS — E872 Acidosis, unspecified: Secondary | ICD-10-CM | POA: Diagnosis present

## 2024-03-09 DIAGNOSIS — Z8249 Family history of ischemic heart disease and other diseases of the circulatory system: Secondary | ICD-10-CM | POA: Diagnosis not present

## 2024-03-09 DIAGNOSIS — Z91048 Other nonmedicinal substance allergy status: Secondary | ICD-10-CM | POA: Diagnosis not present

## 2024-03-09 DIAGNOSIS — Z91030 Bee allergy status: Secondary | ICD-10-CM | POA: Diagnosis not present

## 2024-03-09 DIAGNOSIS — K4030 Unilateral inguinal hernia, with obstruction, without gangrene, not specified as recurrent: Secondary | ICD-10-CM | POA: Diagnosis present

## 2024-03-09 DIAGNOSIS — E876 Hypokalemia: Secondary | ICD-10-CM | POA: Diagnosis present

## 2024-03-09 DIAGNOSIS — F17210 Nicotine dependence, cigarettes, uncomplicated: Secondary | ICD-10-CM | POA: Diagnosis present

## 2024-03-09 DIAGNOSIS — K460 Unspecified abdominal hernia with obstruction, without gangrene: Secondary | ICD-10-CM | POA: Diagnosis present

## 2024-03-09 DIAGNOSIS — K4031 Unilateral inguinal hernia, with obstruction, without gangrene, recurrent: Secondary | ICD-10-CM | POA: Diagnosis present

## 2024-03-09 LAB — CBC
HCT: 46.9 % (ref 39.0–52.0)
Hemoglobin: 16.2 g/dL (ref 13.0–17.0)
MCH: 34.1 pg — ABNORMAL HIGH (ref 26.0–34.0)
MCHC: 34.5 g/dL (ref 30.0–36.0)
MCV: 98.7 fL (ref 80.0–100.0)
Platelets: 195 K/uL (ref 150–400)
RBC: 4.75 MIL/uL (ref 4.22–5.81)
RDW: 12.5 % (ref 11.5–15.5)
WBC: 12.8 K/uL — ABNORMAL HIGH (ref 4.0–10.5)
nRBC: 0 % (ref 0.0–0.2)

## 2024-03-09 LAB — BASIC METABOLIC PANEL WITH GFR
Anion gap: 8 (ref 5–15)
BUN: 11 mg/dL (ref 6–20)
CO2: 25 mmol/L (ref 22–32)
Calcium: 8.2 mg/dL — ABNORMAL LOW (ref 8.9–10.3)
Chloride: 103 mmol/L (ref 98–111)
Creatinine, Ser: 0.82 mg/dL (ref 0.61–1.24)
GFR, Estimated: >60 mL/min (ref >60)
Glucose, Bld: 90 mg/dL (ref 70–99)
Potassium: 3.3 mmol/L — ABNORMAL LOW (ref 3.5–5.1)
Sodium: 136 mmol/L (ref 135–145)

## 2024-03-09 MED ORDER — PANTOPRAZOLE SODIUM 40 MG IV SOLR
40.0000 mg | Freq: Every day | INTRAVENOUS | Status: DC
  Administered 2024-03-09 (×2): 40 mg via INTRAVENOUS
  Filled 2024-03-09 (×2): qty 10

## 2024-03-09 MED ORDER — SIMETHICONE 80 MG PO CHEW: 40.0000 mg | CHEWABLE_TABLET | Freq: Four times a day (QID) | ORAL | Status: DC | PRN

## 2024-03-09 MED ORDER — NICOTINE 14 MG/24HR TD PT24
14.0000 mg | MEDICATED_PATCH | Freq: Every day | TRANSDERMAL | Status: DC
  Administered 2024-03-09 – 2024-03-10 (×2): 14 mg via TRANSDERMAL
  Filled 2024-03-09 (×2): qty 1

## 2024-03-09 MED ORDER — DIPHENHYDRAMINE HCL 12.5 MG/5ML PO ELIX: 12.5000 mg | ORAL_SOLUTION | Freq: Four times a day (QID) | ORAL | Status: DC | PRN

## 2024-03-09 MED ORDER — METOPROLOL TARTRATE 5 MG/5ML IV SOLN: 5.0000 mg | Freq: Four times a day (QID) | INTRAVENOUS | Status: DC | PRN

## 2024-03-09 MED ORDER — DOCUSATE SODIUM 100 MG PO CAPS
100.0000 mg | ORAL_CAPSULE | Freq: Two times a day (BID) | ORAL | Status: DC
  Administered 2024-03-09 – 2024-03-10 (×3): 100 mg via ORAL
  Filled 2024-03-09 (×3): qty 1

## 2024-03-09 MED ORDER — POTASSIUM CHLORIDE 20 MEQ PO PACK
40.0000 meq | PACK | Freq: Once | ORAL | Status: AC
  Administered 2024-03-09: 40 meq via ORAL
  Filled 2024-03-09: qty 2

## 2024-03-09 MED ORDER — ONDANSETRON HCL 4 MG/2ML IJ SOLN: 4.0000 mg | Freq: Four times a day (QID) | INTRAMUSCULAR | Status: DC | PRN

## 2024-03-09 MED ORDER — ONDANSETRON 4 MG PO TBDP: 4.0000 mg | ORAL_TABLET | Freq: Four times a day (QID) | ORAL | Status: DC | PRN

## 2024-03-09 MED ORDER — DIPHENHYDRAMINE HCL 50 MG/ML IJ SOLN: 12.5000 mg | Freq: Four times a day (QID) | INTRAMUSCULAR | Status: DC | PRN

## 2024-03-09 MED ORDER — METHOCARBAMOL 500 MG PO TABS
500.0000 mg | ORAL_TABLET | Freq: Four times a day (QID) | ORAL | Status: DC | PRN
  Administered 2024-03-09: 500 mg via ORAL
  Filled 2024-03-09: qty 1

## 2024-03-09 MED ORDER — MORPHINE SULFATE (PF) 2 MG/ML IV SOLN
2.0000 mg | INTRAVENOUS | Status: DC | PRN
  Administered 2024-03-09 (×2): 2 mg via INTRAVENOUS
  Filled 2024-03-09 (×3): qty 1

## 2024-03-09 MED ORDER — ZOLPIDEM TARTRATE 5 MG PO TABS: 5.0000 mg | ORAL_TABLET | Freq: Every evening | ORAL | Status: DC | PRN

## 2024-03-09 MED ORDER — ACETAMINOPHEN 500 MG PO TABS
1000.0000 mg | ORAL_TABLET | Freq: Four times a day (QID) | ORAL | Status: DC
  Administered 2024-03-09 – 2024-03-10 (×5): 1000 mg via ORAL
  Filled 2024-03-09 (×6): qty 2

## 2024-03-09 MED ORDER — OXYCODONE HCL 5 MG PO TABS
5.0000 mg | ORAL_TABLET | ORAL | Status: DC | PRN
  Administered 2024-03-09 (×2): 10 mg via ORAL
  Filled 2024-03-09 (×2): qty 2

## 2024-03-09 NOTE — Progress Notes (Addendum)
 1 Day Post-Op  Subjective: Benjamin Reyes reports feeling a lot better than yesterday this morning. He rates his pain as a 6-7/10 which he states is better with the Tylenol  he received this morning. He denies any ambulation yet as he still has a catheter in. Catheter has been accumulating an appropriate amount of urine that is amber in color. He has not yet had a bowel movement but has been tolerating a liquid diet well. He does not endorse any pain other than that around his incision site or any nausea or vomiting. He has no questions other than when he is expected to be discharged.  Objective: Vital signs in last 24 hours: Temp:  [97.8 F (36.6 C)-98.3 F (36.8 C)] 98.3 F (36.8 C) (07/28 0335) Pulse Rate:  [69-85] 69 (07/28 0335) Resp:  [14-18] 14 (07/28 0335) BP: (118-166)/(71-96) 118/71 (07/28 0335) SpO2:  [92 %-97 %] 93 % (07/28 0335) Weight:  [77.1 kg-80.6 kg] 80.6 kg (07/28 0008) Last BM Date : 03/08/24  Intake/Output from previous day: 07/27 0701 - 07/28 0700 In: 1140 [P.O.:240; I.V.:800; IV Piggyback:100] Out: 405 [Urine:350; Drains:30; Blood:25] Intake/Output this shift: No intake/output data recorded.  Physical Exam Constitutional:      General: He is not in acute distress.    Appearance: Normal appearance.  HENT:     Head: Normocephalic and atraumatic.  Eyes:     General: No scleral icterus.    Extraocular Movements: Extraocular movements intact.     Pupils: Pupils are equal, round, and reactive to light.  Cardiovascular:     Rate and Rhythm: Normal rate and regular rhythm.     Heart sounds: No murmur heard.    No friction rub. No gallop.  Pulmonary:     Effort: Pulmonary effort is normal. No respiratory distress.     Breath sounds: Normal breath sounds.  Abdominal:     General: Abdomen is flat. Bowel sounds are normal. There is no distension.     Palpations: Abdomen is soft.     Tenderness: There is abdominal tenderness.     Comments: Mild tenderness to  palpation in the right lower quadrant Incision: no drainage or erythema noted around incision: around 10 ml of frank red blood in suction drain without any purulence or haziness.  Genitourinary:    Penis: Normal.   Musculoskeletal:     Right lower leg: No edema.     Left lower leg: No edema.  Skin:    General: Skin is warm and dry.  Neurological:     General: No focal deficit present.     Mental Status: He is alert.  Psychiatric:        Mood and Affect: Mood normal.        Behavior: Behavior normal.     Lab Results:  Recent Labs    03/08/24 1855 03/09/24 0504  WBC 8.9 12.8*  HGB 18.4* 16.2  HCT 50.9 46.9  PLT 211 195   BMET Recent Labs    03/08/24 1855 03/09/24 0504  NA 138 136  K 3.2* 3.3*  CL 104 103  CO2 21* 25  GLUCOSE 122* 90  BUN 14 11  CREATININE 0.75 0.82  CALCIUM 9.5 8.2*   PT/INR No results for input(s): LABPROT, INR in the last 72 hours.  Studies/Results: CT ABDOMEN PELVIS W CONTRAST Addendum Date: 03/08/2024 ADDENDUM REPORT: 03/08/2024 19:56 ADDENDUM: These results were called by telephone at the time of interpretation on 03/08/2024 at 7:56 pm to provider [facility] , who  verbally acknowledged these results. Electronically Signed   By: Franky Crease M.D.   On: 03/08/2024 19:56   Result Date: 03/08/2024 CLINICAL DATA:  Enlarging right inguinal hernia EXAM: CT ABDOMEN AND PELVIS WITH CONTRAST TECHNIQUE: Multidetector CT imaging of the abdomen and pelvis was performed using the standard protocol following bolus administration of intravenous contrast. RADIATION DOSE REDUCTION: This exam was performed according to the departmental dose-optimization program which includes automated exposure control, adjustment of the mA and/or kV according to patient size and/or use of iterative reconstruction technique. CONTRAST:  OMNIPAQUE  IOHEXOL  300 MG/ML  SOLN COMPARISON:  12/02/2021 FINDINGS: Lower chest: No acute abnormality. Hepatobiliary: No focal hepatic  abnormality. Gallbladder unremarkable. Pancreas: No focal abnormality or ductal dilatation. Spleen: No focal abnormality.  Normal size. Adrenals/Urinary Tract: No adrenal abnormality. No focal renal abnormality. No stones or hydronephrosis. Urinary bladder is unremarkable. Stomach/Bowel: There is a large right inguinal hernia containing transverse colon. The right colon is mildly dilated with air-fluid levels. Stomach and small bowel are decompressed. Mild bowel wall thickening within the herniated transverse colon. Vascular/Lymphatic: 6 No evidence of aneurysm or adenopathy. Reproductive: No visible focal abnormality. Other: No free fluid or free air. Musculoskeletal: No acute bony abnormality. IMPRESSION: Large right inguinal hernia now containing the transverse colon. The wall of the herniated transverse colon appears mildly thickened suggesting the possibility of vascular compromise and ischemia or venous congestion. Mildly dilated cecum and ascending colon with air-fluid levels suggest a component of obstruction. Small bowel is decompressed. Electronically Signed: By: Franky Crease M.D. On: 03/08/2024 19:51    Anti-infectives: Anti-infectives (From admission, onward)    Start     Dose/Rate Route Frequency Ordered Stop   03/09/24 0600  cefoTEtan  (CEFOTAN ) 2 g in sodium chloride  0.9 % 100 mL IVPB        2 g 200 mL/hr over 30 Minutes Intravenous On call to O.R. 03/08/24 2010 03/08/24 2220   03/08/24 2120  sodium chloride  0.9 % with cefoTEtan  (CEFOTAN ) ADS Med       Note to Pharmacy: [name] S: cabinet override      03/08/24 2120 03/08/24 2157       Assessment/Plan: s/p Procedure(s): REPAIR, HERNIA, INGUINAL, INCARCERATED OMENTECTOMY  Assessment: Patient reports feeling well and labs are stable. Patient has mild hypokalemia likely dilutional so no need for potassium supplementation at this time. He has not yet had a bowel movement but is tolerating liquids.   Plan: Can discontinue his  foley catheter once he is able to get up and use the bathroom. Once passing flatus and/or able to have a bowel movement, can advance diet as tolerated to soft solids. If ambulating, keeping food down well, and able to have a bowel movement, can plan for discharge tomorrow. Drain will remain until follow up in clinic in 10 days.   LOS: 0 days    Benjamin Reyes 03/09/2024

## 2024-03-09 NOTE — Progress Notes (Signed)
 Nurse at bedside, 16 french foley discontinued per Dr Kallie orders,patient tolerated removal with no issues.Patient provided a urinal to void in.Plan of care on going.

## 2024-03-09 NOTE — Plan of Care (Signed)
   Problem: Education: Goal: Knowledge of General Education information will improve Description Including pain rating scale, medication(s)/side effects and non-pharmacologic comfort measures Outcome: Progressing   Problem: Education: Goal: Knowledge of General Education information will improve Description Including pain rating scale, medication(s)/side effects and non-pharmacologic comfort measures Outcome: Progressing

## 2024-03-09 NOTE — Progress Notes (Addendum)
 Potassium level 3.3 this am,Dr Kallie notified. Plan of care on going.

## 2024-03-09 NOTE — Progress Notes (Signed)
 [facility]  Doing fair. No bm or flatus. Tolerated clears.   BP 121/68 (BP Location: Left Arm)   Pulse 65   Temp 98 F (36.7 C) (Oral)   Resp 18   Ht 6' 3 (1.905 m)   Wt 80.6 kg   SpO2 90%   BMI 22.21 kg/m  Soft, non-distended, non-tender, R groin incision c/di with dermabond, appropriately tender JP with  SS fluid   Patient s/p right inguinal hernia repair with mesh and reduction of the colon. Doing well.   Reg diet Foley out WBC slightly up but not unexpected, post operative reactive  H&H stable Needs to have a BM before dc Will aim for tomorrow  Manuelita Pander, MD [facility] [address] [place], [place] [address] [phone] (office)

## 2024-03-09 NOTE — Plan of Care (Signed)
?  Problem: Education: ?Goal: Knowledge of General Education information will improve ?Description: Including pain rating scale, medication(s)/side effects and non-pharmacologic comfort measures ?Outcome: Progressing ?  ?Problem: Health Behavior/Discharge Planning: ?Goal: Ability to manage health-related needs will improve ?Outcome: Progressing ?  ?Problem: Activity: ?Goal: Risk for activity intolerance will decrease ?Outcome: Progressing ?  ?Problem: Nutrition: ?Goal: Adequate nutrition will be maintained ?Outcome: Progressing ?  ?Problem: Elimination: ?Goal: Will not experience complications related to urinary retention ?Outcome: Progressing ?  ?

## 2024-03-09 NOTE — Progress Notes (Signed)
   03/09/24 0858  TOC Brief Assessment  Insurance and Status Reviewed  Patient has primary care physician No (PCP added to follow up for pt to schedule)  Home environment has been reviewed Home w/ spouse  Prior level of function: Independent  Prior/Current Home Services No current home services  Social Drivers of Health Review SDOH reviewed no interventions necessary  Readmission risk has been reviewed Yes  Transition of care needs no transition of care needs at this time

## 2024-03-09 NOTE — Progress Notes (Signed)
 Nurse at bedside,patient alert and oriented times four. Patient c/o pain to surgical site,right lower abdomen rated a 7, a dull achy pain that's intermittent. Tylenol  1000 mg's given per [facility] per scheduled to help with pain.16 french foley intact with amber color urine. Plan of care on going.

## 2024-03-10 ENCOUNTER — Inpatient Hospital Stay (HOSPITAL_COMMUNITY)

## 2024-03-10 ENCOUNTER — Encounter (HOSPITAL_COMMUNITY): Payer: Self-pay | Admitting: General Surgery

## 2024-03-10 LAB — BASIC METABOLIC PANEL WITH GFR
Anion gap: 8 (ref 5–15)
BUN: 9 mg/dL (ref 6–20)
CO2: 29 mmol/L (ref 22–32)
Calcium: 8.5 mg/dL — ABNORMAL LOW (ref 8.9–10.3)
Chloride: 103 mmol/L (ref 98–111)
Creatinine, Ser: 0.79 mg/dL (ref 0.61–1.24)
GFR, Estimated: >60 mL/min (ref >60)
Glucose, Bld: 103 mg/dL — ABNORMAL HIGH (ref 70–99)
Potassium: 3.6 mmol/L (ref 3.5–5.1)
Sodium: 140 mmol/L (ref 135–145)

## 2024-03-10 LAB — CBC WITH DIFFERENTIAL/PLATELET
Abs Immature Granulocytes: 0.04 K/uL (ref 0.00–0.07)
Basophils Absolute: 0.1 K/uL (ref 0.0–0.1)
Basophils Relative: 1 %
Eosinophils Absolute: 0.2 K/uL (ref 0.0–0.5)
Eosinophils Relative: 2 %
HCT: 49.3 % (ref 39.0–52.0)
Hemoglobin: 16.8 g/dL (ref 13.0–17.0)
Immature Granulocytes: 0 %
Lymphocytes Relative: 22 %
Lymphs Abs: 2 K/uL (ref 0.7–4.0)
MCH: 33.7 pg (ref 26.0–34.0)
MCHC: 34.1 g/dL (ref 30.0–36.0)
MCV: 99 fL (ref 80.0–100.0)
Monocytes Absolute: 0.7 K/uL (ref 0.1–1.0)
Monocytes Relative: 7 %
Neutro Abs: 6.5 K/uL (ref 1.7–7.7)
Neutrophils Relative %: 68 %
Platelets: 190 K/uL (ref 150–400)
RBC: 4.98 MIL/uL (ref 4.22–5.81)
RDW: 12.4 % (ref 11.5–15.5)
WBC: 9.4 K/uL (ref 4.0–10.5)
nRBC: 0 % (ref 0.0–0.2)

## 2024-03-10 LAB — SURGICAL PATHOLOGY

## 2024-03-10 MED ORDER — METHOCARBAMOL 500 MG PO TABS: 500.0000 mg | ORAL_TABLET | Freq: Four times a day (QID) | ORAL | 0 refills | Status: AC | PRN

## 2024-03-10 MED ORDER — ONDANSETRON 4 MG PO TBDP: 4.0000 mg | ORAL_TABLET | Freq: Four times a day (QID) | ORAL | 0 refills | Status: AC | PRN

## 2024-03-10 MED ORDER — OXYCODONE HCL 5 MG PO TABS: 5.0000 mg | ORAL_TABLET | ORAL | 0 refills | Status: AC | PRN

## 2024-03-10 NOTE — Discharge Summary (Signed)
 Physician Discharge Summary  Patient ID: Benjamin Reyes MRN: [id number] DOB/AGE: November 29, 1990 33 y.o.  Admit date: 03/08/2024 Discharge date: 03/10/2024  Admission Diagnoses: Incarcerated recurrent right inguinal hernia   Discharge Diagnoses:  Principal Problem:   Incarcerated inguinal hernia Active Problems:   Incarcerated hernia   Discharged Condition: good  Hospital Course: Benjamin Reyes is a 33 yo who had an incarcerated recurrent right inguinal hernia with colon stuck in it. He was taken emergently to surgery and had an open right inguinal hernia repair with mesh and JP drain placement. He did well after surgery. He has been eating, passing gas and had a BM. He was educated on his JP drain and care as this was placed to help prevent scrotal hematoma due to the large hernia size. The day of his discharge he complained of some right leg pain/ tingling. An US  to rule out DVT was done and this was negative.   Consults: None  Significant Diagnostic Studies:  Lab Results  Component Value Date   WBC 9.4 03/10/2024   HGB 16.8 03/10/2024   HCT 49.3 03/10/2024   MCV 99.0 03/10/2024   PLT 190 03/10/2024   Lab Results  Component Value Date   NA 140 03/10/2024   K 3.6 03/10/2024   CO2 29 03/10/2024   GLUCOSE 103 (H) 03/10/2024   BUN 9 03/10/2024   CREATININE 0.79 03/10/2024   CALCIUM 8.5 (L) 03/10/2024   GFRNONAA >60 03/10/2024   CLINICAL DATA:  Right calf pain.  Recent right-sided hernia repair   EXAM: Right LOWER EXTREMITY VENOUS DOPPLER ULTRASOUND   TECHNIQUE: Gray-scale sonography with graded compression, as well as color Doppler and duplex ultrasound were performed to evaluate the lower extremity deep venous systems from the level of the common femoral vein and including the common femoral, femoral, profunda femoral, popliteal and calf veins including the posterior tibial, peroneal and gastrocnemius veins when visible. The superficial great saphenous vein was also  interrogated. Spectral Doppler was utilized to evaluate flow at rest and with distal augmentation maneuvers in the common femoral, femoral and popliteal veins.   COMPARISON:  None Available.   FINDINGS: Contralateral Common Femoral Vein: Respiratory phasicity is normal and symmetric with the symptomatic side. No evidence of thrombus. Normal compressibility.   Common Femoral Vein: No evidence of thrombus. Normal compressibility, respiratory phasicity and response to augmentation.   Saphenofemoral Junction: No evidence of thrombus. Normal compressibility and flow on color Doppler imaging.   Profunda Femoral Vein: No evidence of thrombus. Normal compressibility and flow on color Doppler imaging.   Femoral Vein: No evidence of thrombus. Normal compressibility, respiratory phasicity and response to augmentation.   Popliteal Vein: No evidence of thrombus. Normal compressibility, respiratory phasicity and response to augmentation.   Calf Veins: No evidence of thrombus. Normal compressibility and flow on color Doppler imaging.   Superficial Great Saphenous Vein: No evidence of thrombus. Normal compressibility.   Venous Reflux:  None.   Other Findings:  None.   IMPRESSION: No evidence of right lower extremity DVT.     Electronically Signed   By: Ranell Bring M.D.   On: 03/10/2024 13:14  Treatments: IV hydration and 03/08/24- Open right inguina hernia repair with mesh, omentectomy, JP drain placement   Discharge Exam: Blood pressure 133/84, pulse 64, temperature 97.8 F (36.6 C), temperature source Oral, resp. rate 20, height 6' 3 (1.905 m), weight 80.6 kg, SpO2 94%. General appearance: alert and no distress Resp: normal work of breathing GI: soft, nondistended, nontender, right groin  incision c/d/I with no erythema or drainage, JP with SS output  Disposition: Discharge disposition: 01-Home or Self Care       Discharge Instructions     Call MD for:  difficulty  breathing, headache or visual disturbances   Complete by: As directed    Call MD for:  extreme fatigue   Complete by: As directed    Call MD for:  persistant dizziness or light-headedness   Complete by: As directed    Call MD for:  persistant nausea and vomiting   Complete by: As directed    Call MD for:  redness, tenderness, or signs of infection (pain, swelling, redness, odor or green/yellow discharge around incision site)   Complete by: As directed    Call MD for:  severe uncontrolled pain   Complete by: As directed    Call MD for:  temperature >100.4   Complete by: As directed    Increase activity slowly   Complete by: As directed       Allergies as of 03/10/2024       Reactions   Bee Venom Other (See Comments)   Unknown    Poison Ivy Extract Rash        Medication List     TAKE these medications    docusate sodium  100 MG capsule Commonly known as: COLACE Take 1 capsule (100 mg total) by mouth 2 (two) times daily.   hydrocortisone  cream 1 % Apply to affected area 2 times daily   ibuprofen  600 MG tablet Commonly known as: ADVIL  Take 1 tablet (600 mg total) by mouth every 6 (six) hours as needed.   methocarbamol  500 MG tablet Commonly known as: ROBAXIN  Take 1 tablet (500 mg total) by mouth every 6 (six) hours as needed for muscle spasms.   ondansetron  4 MG disintegrating tablet Commonly known as: ZOFRAN -ODT Take 1 tablet (4 mg total) by mouth every 6 (six) hours as needed for nausea.   oxyCODONE  5 MG immediate release tablet Commonly known as: Oxy IR/ROXICODONE  Take 1 tablet (5 mg total) by mouth every 4 (four) hours as needed for breakthrough pain or severe pain (pain score 7-10).        Follow-up Information     [facility]. Schedule an appointment as soon as possible for a visit.   Why: Please schedule an appointment with a primary care provider to establish ongoing care Contact information: [address] [place]  [place]  [address] [phone]        Kallie Manuelita BROCKS, MD Follow up on 03/17/2024.   Specialty: General Surgery Why: JP drain removal Contact information: [address] [facility] [facility] [address] [phone]                 Signed: Manuelita BROCKS Kallie 03/10/2024, 2:07 PM

## 2024-03-10 NOTE — Progress Notes (Addendum)
 2 Days Post-Op  Subjective: Patient feels overall well this morning at a 9/10 for wellbeing. He has been able to successfully urinate following removal of his catheter. He has passed flatus but not yet had a bowel movement. He denies nausea or vomiting and is able to tolerate his solid diet well. Patient does have a 1/10 burning pain around his incision. He does endorse pain from his drain site which is what bothers him most at the moment. Pain doesn't radiate. No significant overnight events. No other questions at this time.   Objective: Vital signs in last 24 hours: Temp:  [97.6 F (36.4 C)-98.6 F (37 C)] 97.8 F (36.6 C) (07/29 0348) Pulse Rate:  [64-78] 64 (07/29 0348) Resp:  [16-20] 20 (07/29 0348) BP: (98-133)/(67-84) 133/84 (07/29 0348) SpO2:  [90 %-96 %] 94 % (07/29 0348) Last BM Date : 03/08/24  Intake/Output from previous day: 07/28 0701 - 07/29 0700 In: 1060 [P.O.:1060] Out: 1115 [Urine:1050; Drains:65] Intake/Output this shift: Total I/O In: -  Out: 30 [Drains:30]  General appearance: alert, cooperative, and no distress Head: Normocephalic, without obvious abnormality, atraumatic Throat: abnormal findings: dentition: Missing some teeth Cardio: regular rate and rhythm, S1, S2 normal, no murmur, click, rub or gallop Respiratory: Bilateral expiratory wheezes. This is baseline for the patient for past few years. GI: Mildly tender to palpation at the right upper and lower quadrants, most prominent at the incision site.  Extremities: extremities normal, atraumatic, no cyanosis or edema Pulses: 2+ and symmetric Incision/Wound: Not erythematous or injected with around 5ccs of serosanguinous drainage from jp drain.   Lab Results:  Recent Labs    03/09/24 0504 03/10/24 0437  WBC 12.8* 9.4  HGB 16.2 16.8  HCT 46.9 49.3  PLT 195 190   BMET Recent Labs    03/09/24 0504 03/10/24 0437  NA 136 140  K 3.3* 3.6  CL 103 103  CO2 25 29  GLUCOSE 90 103*  BUN 11 9   CREATININE 0.82 0.79  CALCIUM 8.2* 8.5*   PT/INR No results for input(s): LABPROT, INR in the last 72 hours.  Studies/Results: CT ABDOMEN PELVIS W CONTRAST Addendum Date: 03/08/2024 ADDENDUM REPORT: 03/08/2024 19:56 ADDENDUM: These results were called by telephone at the time of interpretation on 03/08/2024 at 7:56 pm to provider [facility] , who verbally acknowledged these results. Electronically Signed   By: Franky Crease M.D.   On: 03/08/2024 19:56   Result Date: 03/08/2024 CLINICAL DATA:  Enlarging right inguinal hernia EXAM: CT ABDOMEN AND PELVIS WITH CONTRAST TECHNIQUE: Multidetector CT imaging of the abdomen and pelvis was performed using the standard protocol following bolus administration of intravenous contrast. RADIATION DOSE REDUCTION: This exam was performed according to the departmental dose-optimization program which includes automated exposure control, adjustment of the mA and/or kV according to patient size and/or use of iterative reconstruction technique. CONTRAST:  OMNIPAQUE  IOHEXOL  300 MG/ML  SOLN COMPARISON:  12/02/2021 FINDINGS: Lower chest: No acute abnormality. Hepatobiliary: No focal hepatic abnormality. Gallbladder unremarkable. Pancreas: No focal abnormality or ductal dilatation. Spleen: No focal abnormality.  Normal size. Adrenals/Urinary Tract: No adrenal abnormality. No focal renal abnormality. No stones or hydronephrosis. Urinary bladder is unremarkable. Stomach/Bowel: There is a large right inguinal hernia containing transverse colon. The right colon is mildly dilated with air-fluid levels. Stomach and small bowel are decompressed. Mild bowel wall thickening within the herniated transverse colon. Vascular/Lymphatic: 6 No evidence of aneurysm or adenopathy. Reproductive: No visible focal abnormality. Other: No free fluid or free air.  Musculoskeletal: No acute bony abnormality. IMPRESSION: Large right inguinal hernia now containing the transverse colon. The wall  of the herniated transverse colon appears mildly thickened suggesting the possibility of vascular compromise and ischemia or venous congestion. Mildly dilated cecum and ascending colon with air-fluid levels suggest a component of obstruction. Small bowel is decompressed. Electronically Signed: By: Franky Crease M.D. On: 03/08/2024 19:51    Anti-infectives: Anti-infectives (From admission, onward)    Start     Dose/Rate Route Frequency Ordered Stop   03/09/24 0600  cefoTEtan  (CEFOTAN ) 2 g in sodium chloride  0.9 % 100 mL IVPB        2 g 200 mL/hr over 30 Minutes Intravenous On call to O.R. 03/08/24 2010 03/08/24 2220   03/08/24 2120  sodium chloride  0.9 % with cefoTEtan  (CEFOTAN ) ADS Med       Note to Pharmacy: [name] S: cabinet override      03/08/24 2120 03/08/24 2157       Assessment/Plan: s/p Procedure(s): REPAIR, HERNIA, INGUINAL, INCARCERATED OMENTECTOMY  Assessment: Patient is overall doing well and is able to ambulate. He does endorse some right calf numbness when walking. It is nontender, peripheral pulses are all present, no swelling is noted in either lower extremity making DVT seem less likely at this point but still worth investigating given risk factors. He is urinating and passing flatus and eating solid foods. We are currently awaiting a BM. Hypokalemia is resolved and other labs have normalized.   Plan: Discharge pending achievement of bowel movement and results of DVT imaging. Obtain right lower extremity venous duplex ultrasound.   LOS: 1 day    Benjamin Reyes 03/10/2024

## 2024-03-10 NOTE — Discharge Instructions (Signed)
 Discharge Instructions Hernia:  Common Complaints: Pain at the incision site is common. This will improve with time. Take your pain medications as described below. Some nausea is common and poor appetite. The main goal is to stay hydrated the first few days after surgery.  Numbness at the incision or the thigh is common.  If you start to have burning or tingling pain in your groin, this is from a nerve being pinched. Please call and we can prescribe you a different type of pain medication for nerve pain.  Bruising in the side of the repair and down into the scrotum on that side is normal, if the bruising is spreading, call the office.  Some swelling is normal, place a towel under your scrotum (folded into a square) to help with elevation of the scrotum and swelling. Ice the side for the first 72 hours.   Drain Care:  Please keep the drain clean and dry. Replace the gauze/ tape around the drain if it gets dirty or wet/ saturated. Please do not mess with or cut the stitch that is keeping the drain in place. Secure the drain to your clothes so that it does not get dislodged.  You may want to wear a binder around your abdomen (girdle) at night for sleeping if you are worried about it getting pulled out.  Please record the output from the drain daily including the color and the amount in milliliters.  Please keep the drain and entry site covered when you shower so that it does not get wet.    Diet/ Activity: Diet as tolerated. You may not have an appetite, but it is important to stay hydrated. Drink 64 ounces of water a day. Your appetite will return with time.  Shower per your regular routine daily but do not get the drain wet. Bird bath in the front region and around the JP drain. Change the JP drain dressing every other day or after showers.   Do not take hot showers as this will cause the glue to peel.  Rest and listen to your body, but do not remain in bed all day.  Walk everyday for at  least 15-20 minutes. Deep cough and move around every 1-2 hours in the first few days after surgery.  Do not pick at the dermabond glue on your incision sites.  This glue film will remain in place for 1-2 weeks and will start to peel off.  Do not place lotions or balms on your incision unless instructed to specifically by Dr. Kallie.  Do not lift > 10 lbs, perform excessive bending, pushing, pulling, squatting for 6-8 weeks after surgery.   Pain Expectations and Narcotics: -After surgery you will have pain associated with your incisions and this is normal. The pain is muscular and nerve pain, and will get better with time. -You are encouraged and expected to take non narcotic medications like tylenol  and ibuprofen  (when able) to treat pain as multiple modalities can aid with pain treatment. -Narcotics are only used when pain is severe or there is breakthrough pain. -You are not expected to have a pain score of 0 after surgery, as we cannot prevent pain. A pain score of 3-4 that allows you to be functional, move, walk, and tolerate some activity is the goal. The pain will continue to improve over the days after surgery and is dependent on your surgery. -Due to [place] law, we are only able to give a certain amount of pain medication to treat post  operative pain, and we only give additional narcotics on a patient by patient basis.  -For most laparoscopic surgery, studies have shown that the majority of patients only need 10-15 narcotic pills, and for open surgeries most patients only need 15-20.   -Having appropriate expectations of pain and knowledge of pain management with non narcotics is important as we do not want anyone to become addicted to narcotic pain medication.  -Using ice packs in the first 48 hours and heating pads after 48 hours, wearing an abdominal binder (when recommended), and using over the counter medications are all ways to help with pain management.   -Simple acts like meditation and  mindfulness practices after surgery can also help with pain control and research has proven the benefit of these practices.  Medication: Take tylenol  and ibuprofen  as needed for pain control, alternating every 4-6 hours.  Example:  Tylenol  1000mg  @ 6am, 12noon, 6pm, (Do not exceed 4000mg  of tylenol  a day). Ibuprofen  800mg  @ 9am, 3pm, 9pm, 3am (Do not exceed 3600mg  of ibuprofen  a day).  Take Roxicodone  for breakthrough pain every 4 hours.  Take Colace for constipation related to narcotic pain medication. If you do not have a bowel movement in 2 days, take Miralax over the counter.  Drink plenty of water to also prevent constipation.   Contact Information: If you have questions or concerns, please call our office, [phone], Monday- Thursday 8AM-5PM and Friday 8AM-12Noon.  If it is after hours or on the weekend, please call [facility], [phone], [phone], and ask to speak to the surgeon on call for Dr. Kallie at [facility].

## 2024-03-12 ENCOUNTER — Emergency Department (HOSPITAL_COMMUNITY)

## 2024-03-12 ENCOUNTER — Emergency Department (HOSPITAL_COMMUNITY)
Admission: EM | Admit: 2024-03-12 | Discharge: 2024-03-12 | Disposition: A | Discharge status: Home / Self Care | Attending: Emergency Medicine | Admitting: Emergency Medicine

## 2024-03-12 ENCOUNTER — Encounter (HOSPITAL_COMMUNITY): Payer: Self-pay

## 2024-03-12 ENCOUNTER — Other Ambulatory Visit: Payer: Self-pay

## 2024-03-12 DIAGNOSIS — N5089 Other specified disorders of the male genital organs: Secondary | ICD-10-CM | POA: Insufficient documentation

## 2024-03-12 DIAGNOSIS — R202 Paresthesia of skin: Secondary | ICD-10-CM | POA: Insufficient documentation

## 2024-03-12 DIAGNOSIS — G8918 Other acute postprocedural pain: Secondary | ICD-10-CM | POA: Diagnosis not present

## 2024-03-12 DIAGNOSIS — R1031 Right lower quadrant pain: Secondary | ICD-10-CM | POA: Diagnosis present

## 2024-03-12 LAB — COMPREHENSIVE METABOLIC PANEL WITH GFR
ALT: 21 U/L (ref 0–44)
AST: 19 U/L (ref 15–41)
Albumin: 4.4 g/dL (ref 3.5–5.0)
Alkaline Phosphatase: 130 U/L — ABNORMAL HIGH (ref 38–126)
Anion gap: 10 (ref 5–15)
BUN: 11 mg/dL (ref 6–20)
CO2: 27 mmol/L (ref 22–32)
Calcium: 9.8 mg/dL (ref 8.9–10.3)
Chloride: 102 mmol/L (ref 98–111)
Creatinine, Ser: 0.79 mg/dL (ref 0.61–1.24)
GFR, Estimated: >60 mL/min (ref >60)
Glucose, Bld: 88 mg/dL (ref 70–99)
Potassium: 4.1 mmol/L (ref 3.5–5.1)
Sodium: 139 mmol/L (ref 135–145)
Total Bilirubin: 0.9 mg/dL (ref 0.0–1.2)
Total Protein: 8.7 g/dL — ABNORMAL HIGH (ref 6.5–8.1)

## 2024-03-12 LAB — URINALYSIS, ROUTINE W REFLEX MICROSCOPIC
Bacteria, UA: NONE SEEN
Bilirubin Urine: NEGATIVE
Glucose, UA: NEGATIVE mg/dL
Hgb urine dipstick: NEGATIVE
Ketones, ur: 5 mg/dL — AB
Leukocytes,Ua: NEGATIVE
Nitrite: NEGATIVE
Protein, ur: NEGATIVE mg/dL
pH: 6 (ref 5.0–8.0)

## 2024-03-12 LAB — LIPASE, BLOOD: Lipase: 32 U/L (ref 11–51)

## 2024-03-12 LAB — CBC WITH DIFFERENTIAL/PLATELET
Abs Immature Granulocytes: 0.04 K/uL (ref 0.00–0.07)
Basophils Absolute: 0.1 K/uL (ref 0.0–0.1)
Basophils Relative: 1 %
Eosinophils Absolute: 0.2 K/uL (ref 0.0–0.5)
Eosinophils Relative: 2 %
HCT: 58 % — ABNORMAL HIGH (ref 39.0–52.0)
Hemoglobin: 20.1 g/dL — ABNORMAL HIGH (ref 13.0–17.0)
Immature Granulocytes: 0 %
Lymphocytes Relative: 15 %
Lymphs Abs: 1.7 K/uL (ref 0.7–4.0)
MCH: 34.1 pg — ABNORMAL HIGH (ref 26.0–34.0)
MCHC: 34.7 g/dL (ref 30.0–36.0)
MCV: 98.5 fL (ref 80.0–100.0)
Monocytes Absolute: 0.6 K/uL (ref 0.1–1.0)
Monocytes Relative: 6 %
Neutro Abs: 8.5 K/uL — ABNORMAL HIGH (ref 1.7–7.7)
Neutrophils Relative %: 76 %
Platelets: 230 K/uL (ref 150–400)
RBC: 5.89 MIL/uL — ABNORMAL HIGH (ref 4.22–5.81)
RDW: 12.3 % (ref 11.5–15.5)
WBC: 11.1 K/uL — ABNORMAL HIGH (ref 4.0–10.5)
nRBC: 0 % (ref 0.0–0.2)

## 2024-03-12 MED ORDER — SODIUM CHLORIDE 0.9 % IV BOLUS
1000.0000 mL | Freq: Once | INTRAVENOUS | Status: AC
  Administered 2024-03-12: 1000 mL via INTRAVENOUS

## 2024-03-12 MED ORDER — IOHEXOL 300 MG/ML  SOLN
100.0000 mL | Freq: Once | INTRAMUSCULAR | Status: AC | PRN
  Administered 2024-03-12: 100 mL via INTRAVENOUS

## 2024-03-12 NOTE — ED Notes (Signed)
 Patient transported to MRI

## 2024-03-12 NOTE — Discharge Instructions (Addendum)
 Providers Accepting New Patients in [place], [place]    [facility] [address]  [place], [place] [address]J [phone] Accepts most insurances  [facility] [address] [place], [place] [address] [phone] Accepts most insurances  [facility] [address] [place], [place] [address]  [phone] Must meet requirements  [facility] [address] [place], [place] [address] [phone] Accepts most insurances  [facility] [address]  [place], [place] [address] [phone] Accepts most insurances  [facility] [address]   [place], [place]   [phone] Accepts most insurances  [facility])  [id number]  [place], [place] [address] [phone] Accepts most insurances     [facility] [address]  [place], [place] [address] [phone] Accepts most insurances  [facility] [address] [place], [place] [address] [phone] option 1 Accepts Medicaid and [facility] [address]  [place], [place] [address] [phone] Accepts most insurances  Benita Outhouse, MD [address] [place], [place] [address] [phone] Accepts most insurances  [facility] [address]  [place], [place] [address] [phone] Accepts most insurances  [facility] [phone] [address] [place], [place] [address] [phone] Accepts most insurances  [place],  [address] [place], [place] [address] [phone]  Accepts most insurances    Continue to milk the drainage tube daily.  Keep your upcoming appointment with Dr. Kallie on Tuesday.  Return to the emergency department for any new or worsening symptoms

## 2024-03-12 NOTE — ED Provider Notes (Signed)
 [name] EMERGENCY DEPARTMENT AT [facility] Provider Note   CSN: [id number] Arrival date & time: 03/12/24  1241     Patient presents with: Numbness (Ride side )   Benjamin Reyes is a 33 y.o. male.   HPI      Benjamin Reyes is a 33 y.o. male who presents to the Emergency Department complaining of right sided numbness and tingling of his right arm, right hip and right leg.  He states he had symptoms upon waking, the numbness and tingling did not involve his neck or face.  Describes as a pins and needle sensation.  He states the symptoms are resolving.  He endorses sleeping in a recliner since having recent hernia surgery 4 days ago.  He believes that his sleeping position may be attributed to the symptoms.  He is also having pain of his right groin with swelling to his right testicle.  He states that he has a drain placed in his groin area since the surgery.  He states the area has been draining fluid but he has been milking the tubing as instructed, but woke with increased swelling of the right scrotal area.  He denies headache, dizziness, facial numbness or weakness, visual changes, abdominal pain, nausea vomiting, fever or chills.  He states he is urinating and defecating without difficulty.      Prior to Admission medications   Medication Sig Start Date End Date Taking? Authorizing Provider  docusate sodium  (COLACE) 100 MG capsule Take 1 capsule (100 mg total) by mouth 2 (two) times daily. 12/04/21   Bergman, Meghan D, NP  hydrocortisone  cream 1 % Apply to affected area 2 times daily 12/31/18   Loetta Senior, MD  ibuprofen  (ADVIL ,MOTRIN ) 600 MG tablet Take 1 tablet (600 mg total) by mouth every 6 (six) hours as needed. 08/23/14   Randol Simmonds, MD  methocarbamol  (ROBAXIN ) 500 MG tablet Take 1 tablet (500 mg total) by mouth every 6 (six) hours as needed for muscle spasms. 03/10/24   Kallie Manuelita BROCKS, MD  ondansetron  (ZOFRAN -ODT) 4 MG disintegrating tablet Take 1 tablet (4 mg  total) by mouth every 6 (six) hours as needed for nausea. 03/10/24   Kallie Manuelita BROCKS, MD  oxyCODONE  (OXY IR/ROXICODONE ) 5 MG immediate release tablet Take 1 tablet (5 mg total) by mouth every 4 (four) hours as needed for breakthrough pain or severe pain (pain score 7-10). 03/10/24   Kallie Manuelita BROCKS, MD    Allergies: Bee venom and Poison ivy extract    Review of Systems  Constitutional:  Negative for chills and fever.  Eyes:  Negative for visual disturbance.  Respiratory:  Negative for shortness of breath.   Cardiovascular:  Negative for chest pain.  Gastrointestinal:  Negative for abdominal pain, nausea and vomiting.  Genitourinary:  Positive for scrotal swelling. Negative for decreased urine volume, difficulty urinating, dysuria, penile swelling and testicular pain.  Musculoskeletal:  Negative for back pain and neck pain.  Neurological:  Positive for numbness. Negative for dizziness, seizures, syncope, speech difficulty, weakness and headaches.    Updated Vital Signs BP (!) 101/41 (BP Location: Right Arm)   Pulse (!) 104   Temp 98 F (36.7 C) (Oral)   Resp 16   Ht 6' 3 (1.905 m)   Wt 79.4 kg   SpO2 98%   BMI 21.87 kg/m   Physical Exam Vitals and nursing note reviewed. Exam conducted with a chaperone present.  Constitutional:      General: He is not in acute  distress.    Appearance: Normal appearance. He is not ill-appearing or toxic-appearing.  HENT:     Mouth/Throat:     Mouth: Mucous membranes are moist.  Eyes:     Extraocular Movements: Extraocular movements intact.     Conjunctiva/sclera: Conjunctivae normal.     Pupils: Pupils are equal, round, and reactive to light.  Cardiovascular:     Rate and Rhythm: Normal rate and regular rhythm.     Pulses: Normal pulses.  Pulmonary:     Effort: Pulmonary effort is normal.  Chest:     Chest wall: No tenderness.  Abdominal:     Palpations: Abdomen is soft.     Tenderness: There is abdominal tenderness.     Comments:  Tenderness to palpation right lower abdomen.  Abdomen is soft bowel sounds present x 4  There is JP drain right pelvic area.  Serosanguineous fluid within the bulb and tubing  Genitourinary:    Penis: Normal. No discharge.      Epididymis:     Right: No mass or tenderness.     Comments: Swelling with mild tenderness to palpation of the right scrotum.  No tenderness of the epididymis, no erythema Musculoskeletal:        General: Normal range of motion.     Cervical back: No rigidity or tenderness.  Skin:    General: Skin is warm.     Capillary Refill: Capillary refill takes less than 2 seconds.  Neurological:     General: No focal deficit present.     Mental Status: He is alert.     GCS: GCS eye subscore is 4. GCS verbal subscore is 5. GCS motor subscore is 6.     Cranial Nerves: Cranial nerves 2-12 are intact.     Sensory: Sensation is intact. No sensory deficit.     Motor: Motor function is intact. No weakness.     Coordination: Coordination is intact.     Comments: CN II through XII intact.  Speech clear.  No facial droop, no pronator drift, normal finger-nose testing     (all labs ordered are listed, but only abnormal results are displayed) Labs Reviewed  CBC WITH DIFFERENTIAL/PLATELET - Abnormal; Notable for the following components:      Result Value   WBC 11.1 (*)    RBC 5.89 (*)    Hemoglobin 20.1 (*)    HCT 58.0 (*)    MCH 34.1 (*)    Neutro Abs 8.5 (*)    All other components within normal limits  COMPREHENSIVE METABOLIC PANEL WITH GFR - Abnormal; Notable for the following components:   Total Protein 8.7 (*)    Alkaline Phosphatase 130 (*)    All other components within normal limits  LIPASE, BLOOD  URINALYSIS, ROUTINE W REFLEX MICROSCOPIC    EKG: None  Radiology: CT ABDOMEN PELVIS W CONTRAST Result Date: 03/12/2024 CLINICAL DATA:  Abdominal pain, postoperative right inguinal hernia repair EXAM: CT ABDOMEN AND PELVIS WITH CONTRAST TECHNIQUE: Multidetector CT  imaging of the abdomen and pelvis was performed using the standard protocol following bolus administration of intravenous contrast. RADIATION DOSE REDUCTION: This exam was performed according to the departmental dose-optimization program which includes automated exposure control, adjustment of the mA and/or kV according to patient size and/or use of iterative reconstruction technique. CONTRAST:  OMNIPAQUE  IOHEXOL  300 MG/ML  SOLN COMPARISON:  CT abdomen pelvis March 08, 2024 FINDINGS: Lower chest: No acute abnormality. Hepatobiliary: No focal liver abnormality is seen. No gallstones, gallbladder wall  thickening, or biliary dilatation. Pancreas: Unremarkable. No pancreatic ductal dilatation or surrounding inflammatory changes. Spleen: Normal in size without focal abnormality. Adrenals/Urinary Tract: Adrenal glands are unremarkable. Kidneys are normal, without renal calculi, focal lesion, or hydronephrosis. Bladder is unremarkable. Stomach/Bowel: Stomach is within normal limits. There is fecalization of small bowel contents suggestive of slow transit. Transverse colon identified in proper anatomic position. Expected mild peripheral fat stranding within the mesentery/omentum in left upper quadrant. No free fluid. Appendix appears normal. No evidence of bowel wall thickening, distention, or inflammatory changes. No bowel obstruction. Vascular/Lymphatic: No significant vascular findings are present. No free fluid. Prominent bilateral and pericentimeter enhancing inguinal lymph nodes likely reactive. Subcentimeter Reproductive: Mild prostatomegaly. Other: Patient is status post right inguinal hernia repair. Postsurgical changes in right inguinal canal. There is a fluid collection/seroma in right inguinal canal measuring 5.8 x 2.8 x 4.9 cm containing an eccentric focus of air on associated peripheral fat stranding. Small amount of fluid is identified in right scrotal sac. Transcutaneous drainage catheter identified in  right scrotal sac. Musculoskeletal: No acute or significant osseous findings. IMPRESSION: Posttreatment changes in right inguinal canal with associated fluid collection/postsurgical seroma. Proper position of transverse colon within the abdominal cavity. No bowel obstruction. Follow-up to ensure resolution. Right scrotal sac small amount of fluid. Drainage catheter terminates in right scrotal sac anterior aspect. Electronically Signed   By: Megan  Zare M.D.   On: 03/12/2024 16:53   MR BRAIN WO CONTRAST Result Date: 03/12/2024 CLINICAL DATA:  Neuro deficit, acute, stroke suspected right sided numbness EXAM: MRI HEAD WITHOUT CONTRAST TECHNIQUE: Multiplanar, multiecho pulse sequences of the brain and surrounding structures were obtained without intravenous contrast. COMPARISON:  MRI of the head dated February 11, 2023. FINDINGS: Brain: A holo hemispheric subdural fluid collection is again demonstrated on the left. Relative to the previous study, it has decreased in size, but there are now hemorrhagic products within the collection, which primarily later dependently. There is also a medial rim of hemosiderin staining. The collection now measures up to approximately 15 mm in thickness, but was previously 24 mm. The ventricles are proportionate in size. There is no significant shift of the midline structures to the right. The brain is grossly unremarkable. There is no evidence of parenchymal hemorrhage, mass, infarct or hydrocephalus. An arachnoid cyst is again demonstrated anteriorly within the left middle cranial fossa. Vascular: Normal flow voids. Skull and upper cervical spine: Normal marrow signal. No osseous lesions. Sinuses/Orbits: Mild mucosal disease within the ethmoid and left maxillary sinuses. Normal orbits. Other: None. IMPRESSION: 1. 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 2. These results were called by telephone at the time of interpretation on 03/12/2024 at 3:44 pm to provider Dr. Glendia Breeding, who verbally acknowledged these results. Electronically Signed   By: Evalene Coho M.D.   On: 03/12/2024 15:44     Procedures   Medications Ordered in the ED - No data to display                                  Medical Decision Making Patient here for evaluation of right sided numbness and tingling to the right upper and lower extremities, neck and face are spared.  Denies any headache or dizziness or muscle weakness.  Symptoms began upon waking after  sleeping in a recliner. Pt states symptoms are resolving upon my exam.  On review of medical records, patient had subdural hematoma one year ago after a fall and seen by neurosurgery.  I do not appreciate any focal neurodeficits on my exam.  He also is complaining of swelling of his right scrotum and tenderness of his right lower abdomen.  He underwent inguinal hernia repair 4 days ago.  He has a JP drain in place which is draining serosanguineous fluid.  He denies any fever chills nausea or vomiting.  Differential would include but not limited to worsening of his subdural hematoma, intracranial hemorrhage, venous sinus thrombosis, positional paresthesias.  Swelling of the right scrotum possibly secondary to infectious, postop change, obstruction of the drain also considered   Amount and/or Complexity of Data Reviewed Labs: ordered.    Details: No significant leukocytosis, urinalysis without evidence of infection chemistries reassuring Radiology: ordered.    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  Continues to have some swelling of the right scrotum.  Even though his previous paresthesias have completely resolved at this time and he has no muscle weakness on exam, I have recommended close out patient f/u with his neurologist   Discussed post op findings with general surgery, Dr. Mavis who recommended patient continue to express liquid from the drain tube into the bulb.  Patient to keep his upcoming appointment on Tuesday with Dr. Kallie  Pt states he is ready for d/c home, he is ambulatory in the dept and gait is steady.  He was given strict return precautions  Risk Prescription drug management.        Final diagnoses:  Paresthesias  Post-op pain    ED Discharge Orders     None          Herlinda Milling, PA-C 03/16/24 1605    Freddi Hamilton, MD 03/19/24 [phone]

## 2024-03-12 NOTE — ED Triage Notes (Signed)
 Pt comes in for right sided numbness and tingling. Pt woke up like this and the sensation has not gone away. Pt is A&Ox4 and able to walk to triage from waiting room with no assistance. Hernia surgery was 4 days ago.

## 2024-03-12 NOTE — ED Notes (Signed)
 Pt provided warm blanket. Pt reports following recent inguinal hernia repair surgery his right scrotum has swollen in size and reports his right hand has developed a tingling sensation.

## 2024-03-12 NOTE — ED Notes (Signed)
 Patient transported to CT

## 2024-03-17 ENCOUNTER — Ambulatory Visit (INDEPENDENT_AMBULATORY_CARE_PROVIDER_SITE_OTHER): Admitting: General Surgery

## 2024-03-17 ENCOUNTER — Encounter: Payer: Self-pay | Admitting: General Surgery

## 2024-03-17 VITALS — BP 111/74 | HR 70 | Temp 97.9°F | Resp 16 | Ht 75.0 in | Wt 181.0 lb

## 2024-03-17 DIAGNOSIS — K4030 Unilateral inguinal hernia, with obstruction, without gangrene, not specified as recurrent: Secondary | ICD-10-CM

## 2024-03-17 NOTE — Patient Instructions (Signed)
 Remove the bandage on 03/19/2024. Until then take birdbaths and do not get the bandage wet.  After 8/7 you can shower but do not submerge in a tub until after cleared by Dr. Kallie. No heavy lifting > 10 lbs, excessive bending, pushing, pulling, or squatting for 6-8 weeks after surgery.

## 2024-03-17 NOTE — Progress Notes (Unsigned)
 [facility]  Future Appointments  Date Time Provider Department Center  04/15/2024 10:15 AM Kallie Manuelita BROCKS, MD [facility] None

## 2024-04-15 ENCOUNTER — Encounter: Admitting: General Surgery
